# Patient Record
Sex: Male | Born: 1950
Health system: Southern US, Community
[De-identification: ages and names within clinical notes are randomized; demographics above are authoritative.]

## PROBLEM LIST (undated history)

## (undated) DIAGNOSIS — I7789 Other specified disorders of arteries and arterioles: Secondary | ICD-10-CM

## (undated) DIAGNOSIS — I351 Nonrheumatic aortic (valve) insufficiency: Secondary | ICD-10-CM

## (undated) DIAGNOSIS — I10 Essential (primary) hypertension: Secondary | ICD-10-CM

## (undated) DIAGNOSIS — E785 Hyperlipidemia, unspecified: Secondary | ICD-10-CM

## (undated) DIAGNOSIS — I4891 Unspecified atrial fibrillation: Secondary | ICD-10-CM

## (undated) DIAGNOSIS — K4090 Unilateral inguinal hernia, without obstruction or gangrene, not specified as recurrent: Secondary | ICD-10-CM

## (undated) HISTORY — DX: Other specified disorders of arteries and arterioles: I77.89

## (undated) HISTORY — DX: Nonrheumatic aortic (valve) insufficiency: I35.1

## (undated) HISTORY — DX: Essential (primary) hypertension: I10

## (undated) HISTORY — DX: Hyperlipidemia, unspecified: E78.5

## (undated) HISTORY — PX: HERNIA REPAIR: SHX51

## (undated) HISTORY — DX: Unspecified atrial fibrillation: I48.91

## (undated) HISTORY — PX: BACK SURGERY: SHX140

---

## 2016-07-02 ENCOUNTER — Other Ambulatory Visit: Payer: Self-pay | Admitting: Internal Medicine

## 2016-07-02 DIAGNOSIS — R011 Cardiac murmur, unspecified: Secondary | ICD-10-CM

## 2016-07-04 ENCOUNTER — Ambulatory Visit (HOSPITAL_COMMUNITY): Payer: BLUE CROSS/BLUE SHIELD | Attending: Cardiology

## 2016-07-04 ENCOUNTER — Other Ambulatory Visit: Payer: Self-pay

## 2016-07-04 ENCOUNTER — Encounter (INDEPENDENT_AMBULATORY_CARE_PROVIDER_SITE_OTHER): Payer: Self-pay

## 2016-07-04 DIAGNOSIS — R011 Cardiac murmur, unspecified: Secondary | ICD-10-CM | POA: Diagnosis not present

## 2016-07-09 ENCOUNTER — Encounter (HOSPITAL_BASED_OUTPATIENT_CLINIC_OR_DEPARTMENT_OTHER): Payer: Self-pay | Admitting: *Deleted

## 2016-07-16 ENCOUNTER — Ambulatory Visit (HOSPITAL_BASED_OUTPATIENT_CLINIC_OR_DEPARTMENT_OTHER)
Admission: RE | Admit: 2016-07-16 | Discharge: 2016-07-16 | Disposition: A | Discharge status: Home / Self Care | Payer: BLUE CROSS/BLUE SHIELD | Source: Ambulatory Visit | Attending: Surgery | Admitting: Surgery

## 2016-07-16 ENCOUNTER — Encounter (HOSPITAL_BASED_OUTPATIENT_CLINIC_OR_DEPARTMENT_OTHER): Payer: Self-pay

## 2016-07-16 ENCOUNTER — Ambulatory Visit: Payer: Self-pay | Admitting: Surgery

## 2016-07-16 ENCOUNTER — Encounter (HOSPITAL_BASED_OUTPATIENT_CLINIC_OR_DEPARTMENT_OTHER)
Admission: RE | Disposition: A | Discharge status: Home / Self Care | Payer: Self-pay | Source: Ambulatory Visit | Attending: Surgery

## 2016-07-16 ENCOUNTER — Ambulatory Visit (HOSPITAL_BASED_OUTPATIENT_CLINIC_OR_DEPARTMENT_OTHER): Payer: BLUE CROSS/BLUE SHIELD | Admitting: Certified Registered"

## 2016-07-16 DIAGNOSIS — K4090 Unilateral inguinal hernia, without obstruction or gangrene, not specified as recurrent: Secondary | ICD-10-CM | POA: Insufficient documentation

## 2016-07-16 DIAGNOSIS — I10 Essential (primary) hypertension: Secondary | ICD-10-CM | POA: Diagnosis not present

## 2016-07-16 HISTORY — PX: INSERTION OF MESH: SHX5868

## 2016-07-16 HISTORY — DX: Unilateral inguinal hernia, without obstruction or gangrene, not specified as recurrent: K40.90

## 2016-07-16 HISTORY — PX: INGUINAL HERNIA REPAIR: SHX194

## 2016-07-16 SURGERY — REPAIR, HERNIA, INGUINAL, ADULT
Anesthesia: General | Site: Inguinal | Laterality: Left

## 2016-07-16 MED ORDER — BUPIVACAINE-EPINEPHRINE (PF) 0.5% -1:200000 IJ SOLN
INTRAMUSCULAR | Status: DC | PRN
  Administered 2016-07-16: 25 mL

## 2016-07-16 MED ORDER — CEFAZOLIN SODIUM-DEXTROSE 2-4 GM/100ML-% IV SOLN
2.0000 g | INTRAVENOUS | Status: AC
  Administered 2016-07-16: 2 g via INTRAVENOUS

## 2016-07-16 MED ORDER — CEFAZOLIN SODIUM-DEXTROSE 2-4 GM/100ML-% IV SOLN
INTRAVENOUS | Status: AC
  Filled 2016-07-16: qty 100

## 2016-07-16 MED ORDER — CHLORHEXIDINE GLUCONATE CLOTH 2 % EX PADS: 6.0000 | MEDICATED_PAD | Freq: Once | CUTANEOUS | Status: DC

## 2016-07-16 MED ORDER — 0.9 % SODIUM CHLORIDE (POUR BTL) OPTIME
TOPICAL | Status: DC | PRN
  Administered 2016-07-16: 100 mL

## 2016-07-16 MED ORDER — PROPOFOL 10 MG/ML IV BOLUS
INTRAVENOUS | Status: DC | PRN
  Administered 2016-07-16: 200 mg via INTRAVENOUS

## 2016-07-16 MED ORDER — SCOPOLAMINE 1 MG/3DAYS TD PT72: 1.0000 | MEDICATED_PATCH | Freq: Once | TRANSDERMAL | Status: DC | PRN

## 2016-07-16 MED ORDER — HEPARIN SODIUM (PORCINE) 5000 UNIT/ML IJ SOLN
5000.0000 [IU] | Freq: Once | INTRAMUSCULAR | Status: AC
  Administered 2016-07-16: 5000 [IU] via SUBCUTANEOUS

## 2016-07-16 MED ORDER — FENTANYL CITRATE (PF) 100 MCG/2ML IJ SOLN
INTRAMUSCULAR | Status: AC
  Filled 2016-07-16: qty 2

## 2016-07-16 MED ORDER — LIDOCAINE 2% (20 MG/ML) 5 ML SYRINGE
INTRAMUSCULAR | Status: AC
  Filled 2016-07-16: qty 5

## 2016-07-16 MED ORDER — HYDROCODONE-ACETAMINOPHEN 5-325 MG PO TABS: 1.0000 | ORAL_TABLET | ORAL | 0 refills | Status: DC | PRN

## 2016-07-16 MED ORDER — LACTATED RINGERS IV SOLN
INTRAVENOUS | Status: DC
  Administered 2016-07-16 (×2): via INTRAVENOUS

## 2016-07-16 MED ORDER — EPHEDRINE SULFATE 50 MG/ML IJ SOLN
INTRAMUSCULAR | Status: DC | PRN
  Administered 2016-07-16 (×2): 10 mg via INTRAVENOUS

## 2016-07-16 MED ORDER — MIDAZOLAM HCL 2 MG/2ML IJ SOLN
1.0000 mg | INTRAMUSCULAR | Status: DC | PRN
  Administered 2016-07-16: 2 mg via INTRAVENOUS

## 2016-07-16 MED ORDER — BUPIVACAINE HCL (PF) 0.25 % IJ SOLN
INTRAMUSCULAR | Status: AC
  Filled 2016-07-16: qty 30

## 2016-07-16 MED ORDER — FENTANYL CITRATE (PF) 100 MCG/2ML IJ SOLN
INTRAMUSCULAR | Status: AC
Start: 2016-07-16 — End: 2016-07-16
  Filled 2016-07-16: qty 2

## 2016-07-16 MED ORDER — FENTANYL CITRATE (PF) 100 MCG/2ML IJ SOLN
50.0000 ug | INTRAMUSCULAR | Status: DC | PRN
  Administered 2016-07-16: 25 ug via INTRAVENOUS
  Administered 2016-07-16: 50 ug via INTRAVENOUS

## 2016-07-16 MED ORDER — HEPARIN SODIUM (PORCINE) 5000 UNIT/ML IJ SOLN
INTRAMUSCULAR | Status: AC
  Filled 2016-07-16: qty 1

## 2016-07-16 MED ORDER — SUGAMMADEX SODIUM 500 MG/5ML IV SOLN
INTRAVENOUS | Status: AC
  Filled 2016-07-16: qty 5

## 2016-07-16 MED ORDER — LIDOCAINE HCL (PF) 1 % IJ SOLN
INTRAMUSCULAR | Status: AC
  Filled 2016-07-16: qty 30

## 2016-07-16 MED ORDER — BUPIVACAINE HCL (PF) 0.25 % IJ SOLN
INTRAMUSCULAR | Status: DC | PRN
  Administered 2016-07-16: 10 mL

## 2016-07-16 MED ORDER — SUGAMMADEX SODIUM 500 MG/5ML IV SOLN
INTRAVENOUS | Status: DC | PRN
  Administered 2016-07-16: 260 mg via INTRAVENOUS

## 2016-07-16 MED ORDER — SODIUM BICARBONATE 4 % IV SOLN
INTRAVENOUS | Status: AC
  Filled 2016-07-16: qty 5

## 2016-07-16 MED ORDER — ONDANSETRON HCL 4 MG/2ML IJ SOLN
INTRAMUSCULAR | Status: DC | PRN
  Administered 2016-07-16: 4 mg via INTRAVENOUS

## 2016-07-16 MED ORDER — ONDANSETRON HCL 4 MG/2ML IJ SOLN
INTRAMUSCULAR | Status: AC
  Filled 2016-07-16: qty 2

## 2016-07-16 MED ORDER — MIDAZOLAM HCL 2 MG/2ML IJ SOLN
INTRAMUSCULAR | Status: AC
  Filled 2016-07-16: qty 2

## 2016-07-16 MED ORDER — ROCURONIUM BROMIDE 100 MG/10ML IV SOLN
INTRAVENOUS | Status: DC | PRN
  Administered 2016-07-16: 40 mg via INTRAVENOUS

## 2016-07-16 MED ORDER — LIDOCAINE HCL (CARDIAC) 20 MG/ML IV SOLN
INTRAVENOUS | Status: DC | PRN
  Administered 2016-07-16: 20 mg via INTRAVENOUS

## 2016-07-16 MED ORDER — DEXAMETHASONE SODIUM PHOSPHATE 10 MG/ML IJ SOLN
INTRAMUSCULAR | Status: AC
  Filled 2016-07-16: qty 1

## 2016-07-16 MED ORDER — DEXAMETHASONE SODIUM PHOSPHATE 4 MG/ML IJ SOLN
INTRAMUSCULAR | Status: DC | PRN
  Administered 2016-07-16: 10 mg via INTRAVENOUS

## 2016-07-16 SURGICAL SUPPLY — 47 items
ADH SKN CLS APL DERMABOND .7 (GAUZE/BANDAGES/DRESSINGS) ×1
BLADE CLIPPER SURG (BLADE) ×1 IMPLANT
BLADE SURG 15 STRL LF DISP TIS (BLADE) ×1 IMPLANT
BLADE SURG 15 STRL SS (BLADE) ×2
CANISTER SUCT 1200ML W/VALVE (MISCELLANEOUS) ×2 IMPLANT
CLEANER CAUTERY TIP 5X5 PAD (MISCELLANEOUS) ×1 IMPLANT
COVER BACK TABLE 60X90IN (DRAPES) ×2 IMPLANT
COVER MAYO STAND STRL (DRAPES) ×2 IMPLANT
DECANTER SPIKE VIAL GLASS SM (MISCELLANEOUS) ×2 IMPLANT
DERMABOND ADVANCED (GAUZE/BANDAGES/DRESSINGS) ×1
DERMABOND ADVANCED .7 DNX12 (GAUZE/BANDAGES/DRESSINGS) ×1 IMPLANT
DRAIN PENROSE 1/2X12 LTX STRL (WOUND CARE) ×2 IMPLANT
DRAPE LAPAROTOMY T 102X78X121 (DRAPES) ×2 IMPLANT
DRAPE UTILITY XL STRL (DRAPES) ×1 IMPLANT
ELECT COATED BLADE 2.86 ST (ELECTRODE) ×1 IMPLANT
ELECT REM PT RETURN 9FT ADLT (ELECTROSURGICAL) ×2
ELECTRODE REM PT RTRN 9FT ADLT (ELECTROSURGICAL) ×1 IMPLANT
GLOVE BIO SURGEON STRL SZ8 (GLOVE) ×2 IMPLANT
GLOVE BIOGEL PI IND STRL 7.5 (GLOVE) IMPLANT
GLOVE BIOGEL PI INDICATOR 7.5 (GLOVE) ×1
GLOVE SURG SYN 7.5  E (GLOVE) ×1
GLOVE SURG SYN 7.5 E (GLOVE) ×1 IMPLANT
GLOVE SURG SYN 7.5 PF PI (GLOVE) IMPLANT
GOWN STRL REUS W/ TWL LRG LVL3 (GOWN DISPOSABLE) ×1 IMPLANT
GOWN STRL REUS W/ TWL XL LVL3 (GOWN DISPOSABLE) ×1 IMPLANT
GOWN STRL REUS W/TWL LRG LVL3 (GOWN DISPOSABLE) ×2
GOWN STRL REUS W/TWL XL LVL3 (GOWN DISPOSABLE) ×2
MESH HERNIA 3X6 (Mesh General) ×1 IMPLANT
NDL HYPO 25X1 1.5 SAFETY (NEEDLE) ×1 IMPLANT
NEEDLE HYPO 25X1 1.5 SAFETY (NEEDLE) ×2 IMPLANT
NS IRRIG 1000ML POUR BTL (IV SOLUTION) ×2 IMPLANT
PACK BASIN DAY SURGERY FS (CUSTOM PROCEDURE TRAY) ×2 IMPLANT
PAD CLEANER CAUTERY TIP 5X5 (MISCELLANEOUS) ×1
PENCIL BUTTON HOLSTER BLD 10FT (ELECTRODE) ×2 IMPLANT
SLEEVE SCD COMPRESS KNEE MED (MISCELLANEOUS) ×1 IMPLANT
SPONGE LAP 4X18 X RAY DECT (DISPOSABLE) ×2 IMPLANT
SUT MON AB 5-0 PS2 18 (SUTURE) ×2 IMPLANT
SUT PROLENE 2 0 CT2 30 (SUTURE) ×5 IMPLANT
SUT VIC AB 2-0 SH 27 (SUTURE) ×6
SUT VIC AB 2-0 SH 27XBRD (SUTURE) ×1 IMPLANT
SUT VIC AB 4-0 SH 18 (SUTURE) ×2 IMPLANT
SYR BULB 3OZ (MISCELLANEOUS) ×2 IMPLANT
SYR CONTROL 10ML LL (SYRINGE) ×2 IMPLANT
TOWEL OR 17X24 6PK STRL BLUE (TOWEL DISPOSABLE) ×4 IMPLANT
TRAY DSU PREP LF (CUSTOM PROCEDURE TRAY) ×2 IMPLANT
TUBE CONNECTING 20X1/4 (TUBING) ×2 IMPLANT
YANKAUER SUCT BULB TIP NO VENT (SUCTIONS) ×2 IMPLANT

## 2016-07-16 NOTE — Transfer of Care (Signed)
Immediate Anesthesia Transfer of Care Note  Patient: Carlos NettleWilliam A Bailey  Procedure(s) Performed: Procedure(s): OPEN LEFT INGUINAL HERNIA REPAIR WITH MESH (Left) INSERTION OF MESH (Left)  Patient Location: PACU  Anesthesia Type:GA combined with regional for post-op pain  Level of Consciousness: awake, alert , oriented and patient cooperative  Airway & Oxygen Therapy: Patient Spontanous Breathing and Patient connected to face mask oxygen  Post-op Assessment: Report given to RN and Post -op Vital signs reviewed and stable  Post vital signs: Reviewed and stable  Last Vitals:  Vitals:   07/16/16 1140 07/16/16 1145  BP:  (!) 171/89  Pulse: 70 75  Resp: 17 16  Temp:      Last Pain:  Vitals:   07/16/16 1014  TempSrc: Oral         Complications: No apparent anesthesia complications

## 2016-07-16 NOTE — Interval H&P Note (Signed)
History and Physical Interval Note:  07/16/2016 12:33 PM  Carlos Bailey  has presented today for surgery, with the diagnosis of left inguinal hernia  The various methods of treatment have been discussed with the patient and family. After consideration of risks, benefits and other options for treatment, the patient has consented to  Procedure(s): OPEN LEFT INGUINAL HERNIA REPAIR (Left) as a surgical intervention .  The patient's history has been reviewed, patient examined, no change in status, stable for surgery.  I have reviewed the patient's chart and labs.  Questions were answered to the patient's satisfaction.     Blane Worthington B

## 2016-07-16 NOTE — Op Note (Signed)
Surgeon: Wenda LowMatt Jassiel Flye, MD, FACS  Asst:  none  Anes:  general  Procedure: Open left inguinal hernia repair (sliding hernia)  Diagnosis: Prominent slider containing sigmoid colon  Complications: none  EBL:   5 cc  Drains: none  Description of Procedure:  The patient was taken to OR 6 at [facility].  After anesthesia was administered and the patient was prepped a timeout was performed.  Patient had received a block per anesthesia.  Endotracheal anesthesia was eventually needed because of the slider.    An oblique incision was made and carried down to the external oblique.  The fascia was opened along the fibers.  The cord was full and contained a prominent hernia that reduced.  Penrose around the cord.  Anteromedial dissection revealed a sac that was entered and the sigmoid colon was found to be about half of the wall of the hernia.    With better relaxation, this was dissected somewhat free, the opened sac was explored digitally before being closed with vicry.  This was reduced and the dilated ring was approximated with a single 2-0 prolene simple suture.  Marlex type mesh was cut to fit the floor and was sewn to the inguinal ligament with interrupted 2-0 prolene and medially with a running 2-0 prolene.  The arms of the mesh which was cut to go around the cord were sewn together with a horizontal 2-0 prolene.  The external oblique were closed with 2-0 vicryl and the wound was closed in layers with vicryl and 4-0 monocryl on the skin.  Dermabond was applied.   The patient tolerated the procedure well and was taken to the PACU in stable condition.     Matt B. Daphine DeutscherMartin, MD, [facility], [place] [phone]

## 2016-07-16 NOTE — Anesthesia Procedure Notes (Signed)
Anesthesia Regional Block:  TAP block  Pre-Anesthetic Checklist: ,, timeout performed, Correct Patient, Correct Site, Correct Laterality, Correct Procedure, Correct Position, site marked, Risks and benefits discussed,  Surgical consent,  Pre-op evaluation,  At surgeon's request and post-op pain management  Laterality: Left  Prep: chloraprep       Needles:   Needle Type: Echogenic Needle     Needle Length: 9cm 9 cm Needle Gauge: 21 and 21 G    Additional Needles:  Procedures: ultrasound guided (picture in chart) TAP block Narrative:  Start time: 07/16/2016 11:32 AM End time: 07/16/2016 11:39 AM Injection made incrementally with aspirations every 5 mL.  Performed by: Personally  Anesthesiologist: Marcene DuosFITZGERALD, Joice Nazario

## 2016-07-16 NOTE — Discharge Instructions (Addendum)
Use ice pack for pain control as needed Ibuprofen can be used in a couple of days if desired If trouble voiding, sit down in a tub of warm water to void May be up and walk up stairs etc Lifting should be limited by straining-don't strain to lift May resume swimming in a couple of weeks.     Post Anesthesia Home Care Instructions  Activity: Get plenty of rest for the remainder of the day. A responsible adult should stay with you for 24 hours following the procedure.  For the next 24 hours, DO NOT: -Drive a car -Advertising copywriterperate machinery -Drink alcoholic beverages -Take any medication unless instructed by your physician -Make any legal decisions or sign important papers.  Meals: Start with liquid foods such as gelatin or soup. Progress to regular foods as tolerated. Avoid greasy, spicy, heavy foods. If nausea and/or vomiting occur, drink only clear liquids until the nausea and/or vomiting subsides. Call your physician if vomiting continues.  Special Instructions/Symptoms: Your throat may feel dry or sore from the anesthesia or the breathing tube placed in your throat during surgery. If this causes discomfort, gargle with warm salt water. The discomfort should disappear within 24 hours.  If you had a scopolamine patch placed behind your ear for the management of post- operative nausea and/or vomiting:  1. The medication in the patch is effective for 72 hours, after which it should be removed.  Wrap patch in a tissue and discard in the trash. Wash hands thoroughly with soap and water. 2. You may remove the patch earlier than 72 hours if you experience unpleasant side effects which may include dry mouth, dizziness or visual disturbances. 3. Avoid touching the patch. Wash your hands with soap and water after contact with the patch.

## 2016-07-16 NOTE — Anesthesia Procedure Notes (Signed)
Procedure Name: Intubation Date/Time: 07/16/2016 1:53 PM Performed by: Pheonix Wisby D Pre-anesthesia Checklist: Patient identified, Emergency Drugs available, Suction available and Patient being monitored Patient Re-evaluated:Patient Re-evaluated prior to inductionOxygen Delivery Method: Circle system utilized Preoxygenation: Pre-oxygenation with 100% oxygen Intubation Type: IV induction Ventilation: Mask ventilation without difficulty Laryngoscope Size: Mac and 3 Grade View: Grade II Tube type: Oral Tube size: 7.0 mm Number of attempts: 1 Airway Equipment and Method: Stylet and Oral airway Placement Confirmation: ETT inserted through vocal cords under direct vision,  positive ETCO2 and breath sounds checked- equal and bilateral Secured at: 21 cm Tube secured with: Tape Dental Injury: Teeth and Oropharynx as per pre-operative assessment

## 2016-07-16 NOTE — Anesthesia Preprocedure Evaluation (Addendum)
Anesthesia Evaluation  Patient identified by MRN, date of birth, ID band Patient awake    Reviewed: Allergy & Precautions, NPO status , Patient's Chart, lab work & pertinent test results  Airway Mallampati: II  TM Distance: <3 FB Neck ROM: Full    Dental  (+) Teeth Intact, Dental Advisory Given   Pulmonary neg pulmonary ROS,    Pulmonary exam normal breath sounds clear to auscultation       Cardiovascular hypertension (no meds currently), Normal cardiovascular exam+ Valvular Problems/Murmurs AI  Rhythm:Regular Rate:Normal  Echo 11/17: Study Conclusions  - Left ventricle: The cavity size was normal. Wall thickness was increased increased in a pattern of mild to moderate LVH. There was mild focal basal hypertrophy of the septum. Systolic function was normal. The estimated ejection fraction was in the range of 55% to 60%. Doppler parameters are consistent with abnormal left ventricular relaxation (grade 1 diastolic dysfunction). - Aortic valve: There was mild regurgitation. Valve area (VTI): 2.9 cm^2. Valve area (Vmax): 2.44 cm^2. Valve area (Vmean): 2.93 cm^2. - Mitral valve: There was mild regurgitation. - Left atrium: The atrium was mildly dilated. - Impressions: The ascending aorta is mild to moderately dilated with mild AI. Consider chest CT to further evaluate.  Impressions:  - The ascending aorta is mild to moderately dilated with mild AI. Consider chest CT to further evaluate.   Neuro/Psych negative neurological ROS     GI/Hepatic negative GI ROS, Neg liver ROS,   Endo/Other  negative endocrine ROS  Renal/GU negative Renal ROS     Musculoskeletal  (+) Arthritis , Osteoarthritis,    Abdominal   Peds  Hematology negative hematology ROS (+)   Anesthesia Other Findings Day of surgery medications reviewed with the patient.  Left inguinal hernia  Reproductive/Obstetrics                             Anesthesia Physical Anesthesia Plan  ASA: II  Anesthesia Plan: General   Post-op Pain Management:  Regional for Post-op pain   Induction: Intravenous  Airway Management Planned: Oral ETT  Additional Equipment:   Intra-op Plan:   Post-operative Plan: Extubation in OR  Informed Consent: I have reviewed the patients History and Physical, chart, labs and discussed the procedure including the risks, benefits and alternatives for the proposed anesthesia with the patient or authorized representative who has indicated his/her understanding and acceptance.   Dental advisory given  Plan Discussed with: CRNA  Anesthesia Plan Comments: (Risks/benefits of general anesthesia discussed with patient including risk of damage to teeth, lips, gum, and tongue, nausea/vomiting, allergic reactions to medications, and the possibility of heart attack, stroke and death.  All patient questions answered.  Patient wishes to proceed.)        Anesthesia Quick Evaluation

## 2016-07-16 NOTE — Anesthesia Procedure Notes (Signed)
Procedure Name: LMA Insertion Date/Time: 07/16/2016 12:41 PM Performed by: Marai Teehan D Pre-anesthesia Checklist: Patient identified, Emergency Drugs available, Suction available and Patient being monitored Patient Re-evaluated:Patient Re-evaluated prior to inductionOxygen Delivery Method: Circle system utilized Preoxygenation: Pre-oxygenation with 100% oxygen Intubation Type: IV induction Ventilation: Mask ventilation without difficulty LMA: LMA inserted LMA Size: 4.0 Number of attempts: 1 Airway Equipment and Method: Bite block Placement Confirmation: positive ETCO2 Tube secured with: Tape Dental Injury: Teeth and Oropharynx as per pre-operative assessment

## 2016-07-16 NOTE — H&P (Signed)
Carlos Bailey 06/28/2016 9:12 AM Location: [facility] Patient #: [id number] DOB: 17-Jul-1951 Married / Language: English / Race: White Male   History of Present Illness Carlos Bailey(Carlos Bailey B. Daphine DeutscherMartin MD; 06/28/2016 9:50 AM) The patient is a 65 year old male who presents with an inguinal hernia. The hernia(s) is/are located on the left side (bulging and notices gurgling). He had a prior RIH when he was in the first grade. Denies obstruction. Followed by Rodrigo RanMark Perini. I gave him a hernia book and discussed open and laparoscopic/robotic hernia repairs. Will proceed with open LEFT inguinal hernia repair.    Other Problems Doristine Devoid(Chemira Jones, CMA; 06/28/2016 9:12 AM) Inguinal Hernia Other disease, cancer, significant illness  Past Surgical History Doristine Devoid(Chemira Jones, CMA; 06/28/2016 9:12 AM) Open Inguinal Hernia Surgery Right.  Diagnostic Studies History Doristine Devoid(Chemira Jones, CMA; 06/28/2016 9:12 AM) Colonoscopy 5-10 years ago  Allergies Doristine Devoid(Chemira Jones, CMA; 06/28/2016 9:13 AM) Codeine Sulfate *ANALGESICS - OPIOID*  Medication History (Doristine DevoidChemira Jones, CMA; 06/28/2016 9:13 AM) Butalbital-Aspirin-Caffeine (50-325-40MG  Capsule, Oral) Active. Medications Reconciled  Social History Doristine Devoid(Chemira Jones, CMA; 06/28/2016 9:12 AM) Alcohol use Moderate alcohol use. Caffeine use Coffee, Tea. No drug use Tobacco use Never smoker.  Family History Doristine Devoid(Chemira Jones, CMA; 06/28/2016 9:12 AM) Arthritis Father, Mother. Diabetes Mellitus Sister. Heart Disease Father.    Review of Systems Doristine Devoid(Chemira Jones CMA; 06/28/2016 9:12 AM) General Not Present- Appetite Loss, Chills, Fatigue, Fever, Night Sweats, Weight Gain and Weight Loss. Skin Not Present- Change in Wart/Mole, Dryness, Hives, Jaundice, New Lesions, Non-Healing Wounds, Rash and Ulcer. HEENT Not Present- Earache, Hearing Loss, Hoarseness, Nose Bleed, Oral Ulcers, Ringing in the Ears, Seasonal Allergies, Sinus Pain, Sore Throat, Visual  Disturbances, Wears glasses/contact lenses and Yellow Eyes. Respiratory Present- Snoring. Not Present- Bloody sputum, Chronic Cough, Difficulty Breathing and Wheezing. Breast Not Present- Breast Mass, Breast Pain, Nipple Discharge and Skin Changes. Cardiovascular Not Present- Chest Pain, Difficulty Breathing Lying Down, Leg Cramps, Palpitations, Rapid Heart Rate, Shortness of Breath and Swelling of Extremities. Gastrointestinal Present- Change in Bowel Habits and Hemorrhoids. Not Present- Abdominal Pain, Bloating, Bloody Stool, Chronic diarrhea, Constipation, Difficulty Swallowing, Excessive gas, Gets full quickly at meals, Indigestion, Nausea, Rectal Pain and Vomiting. Male Genitourinary Not Present- Blood in Urine, Change in Urinary Stream, Frequency, Impotence, Nocturia, Painful Urination, Urgency and Urine Leakage. Musculoskeletal Not Present- Back Pain, Joint Pain, Joint Stiffness, Muscle Pain, Muscle Weakness and Swelling of Extremities. Neurological Not Present- Decreased Memory, Fainting, Headaches, Numbness, Seizures, Tingling, Tremor, Trouble walking and Weakness. Psychiatric Not Present- Anxiety, Bipolar, Change in Sleep Pattern, Depression, Fearful and Frequent crying. Endocrine Not Present- Cold Intolerance, Excessive Hunger, Hair Changes, Heat Intolerance, Hot flashes and New Diabetes. Hematology Not Present- Blood Thinners, Easy Bruising, Excessive bleeding, Gland problems, HIV and Persistent Infections.  Vitals (Chemira Jones CMA; 06/28/2016 9:13 AM) 06/28/2016 9:12 AM Weight: 152.4 lb Height: 68in Body Surface Area: 1.82 m Body Mass Index: 23.17 kg/m  Temp.: 98.73F(Oral)  Pulse: 66 (Regular)  BP: 150/100 (Sitting, Left Arm, Standard)       Physical Exam (Kortney Schoenfelder B. Daphine DeutscherMartin MD; 06/28/2016 9:53 AM) General Note: WDWNWM NAD HEENT glasses Neck without bruits Chest clear Heart SR with 1/6 SEM suggesting AI Abdomen prominent bulge in the left inguinal  region Ext FROM Neuro alert and oriented x 3 with normal motor and sensory function.     Assessment & Plan Carlos Bailey(Sahily Carlos B. Daphine DeutscherMartin MD; 06/28/2016 9:56 AM) LEFT GROIN HERNIA (K40.90) Impression: LIH plan open repair with mesh. I have discussed repair and complications not limited to  pain, numbness, infection and recurrence. I discussed the aortic murmur with Dr. Brunilda Payoran Patterson who will relate this to Dr. Waynard EdwardsPerini

## 2016-07-16 NOTE — Anesthesia Postprocedure Evaluation (Signed)
Anesthesia Post Note  Patient: Carlos NettleWilliam A Bailey  Procedure(s) Performed: Procedure(s) (LRB): OPEN LEFT INGUINAL HERNIA REPAIR WITH MESH (Left) INSERTION OF MESH (Left)  Patient location during evaluation: PACU Anesthesia Type: General and Regional Level of consciousness: awake and alert Pain management: pain level controlled Vital Signs Assessment: post-procedure vital signs reviewed and stable Respiratory status: spontaneous breathing, nonlabored ventilation, respiratory function stable and patient connected to nasal cannula oxygen Cardiovascular status: blood pressure returned to baseline and stable Postop Assessment: no signs of nausea or vomiting Anesthetic complications: no    Last Vitals:  Vitals:   07/16/16 1515 07/16/16 1551  BP: (!) 179/95   Pulse: 94 83  Resp: 13   Temp:  36.4 C    Last Pain:  Vitals:   07/16/16 1551  TempSrc: Oral  PainSc: 2                  Kennieth RadFitzgerald, Lenvil Swaim E

## 2016-07-17 ENCOUNTER — Encounter (HOSPITAL_BASED_OUTPATIENT_CLINIC_OR_DEPARTMENT_OTHER): Payer: Self-pay | Admitting: Surgery

## 2018-09-08 ENCOUNTER — Encounter: Payer: Self-pay | Admitting: Cardiology

## 2018-09-15 ENCOUNTER — Other Ambulatory Visit (HOSPITAL_COMMUNITY): Payer: Self-pay | Admitting: Internal Medicine

## 2018-09-15 ENCOUNTER — Other Ambulatory Visit: Payer: Self-pay | Admitting: Internal Medicine

## 2018-09-15 DIAGNOSIS — I351 Nonrheumatic aortic (valve) insufficiency: Secondary | ICD-10-CM

## 2018-09-16 ENCOUNTER — Other Ambulatory Visit: Payer: Self-pay | Admitting: Internal Medicine

## 2018-09-16 DIAGNOSIS — E785 Hyperlipidemia, unspecified: Secondary | ICD-10-CM

## 2018-09-18 ENCOUNTER — Ambulatory Visit (HOSPITAL_COMMUNITY): Payer: BLUE CROSS/BLUE SHIELD | Attending: Cardiovascular Disease

## 2018-09-18 DIAGNOSIS — I351 Nonrheumatic aortic (valve) insufficiency: Secondary | ICD-10-CM | POA: Diagnosis present

## 2018-09-23 ENCOUNTER — Ambulatory Visit
Admission: RE | Admit: 2018-09-23 | Discharge: 2018-09-23 | Disposition: A | Discharge status: Home / Self Care | Payer: BLUE CROSS/BLUE SHIELD | Source: Ambulatory Visit | Attending: Internal Medicine | Admitting: Internal Medicine

## 2018-09-23 DIAGNOSIS — E785 Hyperlipidemia, unspecified: Secondary | ICD-10-CM

## 2018-10-03 NOTE — Progress Notes (Signed)
Cardiology Office Note   Date:  10/06/2018   ID:  Carlos Bailey, DOB 28-Jun-1951, MRN [id number]  PCP:  Carlos Ran, MD  Cardiologist:   No primary care provider on file. Referring:  Carlos Ran, MD  No chief complaint on file.     History of Present Illness: Carlos Bailey is a 68 y.o. male who is referred by Carlos Ran, MD for evaluation of AI.   This was moderate on echo in January.  I reviewed the echo images for this appt.   There was normal LV size and function.   The patient's had no prior cardiac history.  He is active.  He is a Counselling psychologist exercises.  He denies any cardiovascular symptoms.  The patient denies any new symptoms such as chest discomfort, neck or arm discomfort. There has been no new shortness of breath, PND or orthopnea. There have been no reported palpitations, presyncope or syncope.  He was noted to have a murmur and also "flutter".  Prior to hernia repair recently.  He had a coronary calcium score which was 626 which put him in the 82nd percentile.  Echocardiogram demonstrated aortic insufficiency which was moderate.  I have reviewed these images for this appointment.  He is referred for follow-up.   Past Medical History:  Diagnosis Date  . HTN (hypertension)   . Hyperlipemia   . Inguinal hernia    left    Past Surgical History:  Procedure Laterality Date  . BACK SURGERY    . HERNIA REPAIR     RIHR as child  . INGUINAL HERNIA REPAIR Left 07/16/2016   Procedure: OPEN LEFT INGUINAL HERNIA REPAIR WITH MESH;  Surgeon: Luretha Murphy, MD;  Location: [facility];  Service: General;  Laterality: Left;  . INSERTION OF MESH Left 07/16/2016   Procedure: INSERTION OF MESH;  Surgeon: Luretha Murphy, MD;  Location: [facility];  Service: General;  Laterality: Left;     Current Outpatient Medications  Medication Sig Dispense Refill  . butalbital-aspirin-caffeine (FIORINAL) 50-325-40 MG capsule Take 1 capsule by mouth 2  (two) times daily as needed for headache.    . rosuvastatin (CRESTOR) 10 MG tablet Take 10 mg by mouth daily.    Marland Kitchen olmesartan-hydrochlorothiazide (BENICAR HCT) 40-25 MG tablet Take 1 tablet by mouth daily. 90 tablet 3   No current facility-administered medications for this visit.     Allergies:   Codeine    Social History:  The patient  reports that he has never smoked. He has never used smokeless tobacco. He reports current alcohol use. He reports that he does not use drugs.   Family History:  The patient's family history includes Aneurysm in his mother; Diabetes Mellitus II in his mother and sister; Obesity in his sister; Stroke in his mother.    ROS:  Please see the history of present illness.   Otherwise, review of systems are positive for none.   All other systems are reviewed and negative.    PHYSICAL EXAM: VS:  BP (!) 158/82 (BP Location: Left Arm, Patient Position: Sitting, Cuff Size: Normal)   Pulse 66   Ht 5\' 8"  (1.727 m)   Wt 157 lb (71.2 kg)   BMI 23.87 kg/m  , BMI Body mass index is 23.87 kg/m. GENERAL:  Well appearing HEENT:  Pupils equal round and reactive, fundi not visualized, oral mucosa unremarkable NECK:  No jugular venous distention, waveform within normal limits, carotid upstroke brisk and symmetric, no bruits,  no thyromegaly LYMPHATICS:  No cervical, inguinal adenopathy LUNGS:  Clear to auscultation bilaterally BACK:  No CVA tenderness CHEST:  Unremarkable HEART:  PMI not displaced or sustained,S1 and S2 within normal limits, no S3, no S4, no clicks, no rubs, 2 out of 6 diastolic murmur heard best at the third left intercostal space and lasting well into diastole, no systolic murmurs ABD:  Flat, positive bowel sounds normal in frequency in pitch, no bruits, no rebound, no guarding, no midline pulsatile mass, no hepatomegaly, no splenomegaly EXT:  2 plus pulses throughout, no edema, no cyanosis no clubbing SKIN:  No rashes no nodules NEURO:  Cranial nerves  II through XII grossly intact, motor grossly intact throughout PSYCH:  Cognitively intact, oriented to person place and time    EKG:  EKG is ordered today. The ekg ordered today demonstrates sinus rhythm, rate 66, left axis deviation, poor anterior R wave progression, no acute ST-T wave changes.   Recent Labs: No results found for requested labs within last 8760 hours.    Lipid Panel No results found for: CHOL, TRIG, HDL, CHOLHDL, VLDL, LDLCALC, LDLDIRECT    Wt Readings from Last 3 Encounters:  10/06/18 157 lb (71.2 kg)  07/16/16 143 lb 6.4 oz (65 kg)      Other studies Reviewed: Additional studies/ records that were reviewed today include: Labs, CT, echo images personally reviewed. Review of the above records demonstrates:  Please see elsewhere in the note.     ASSESSMENT AND PLAN:  AORTIC INSUFFICIENCY:  This was moderate with normal LV size and function.  I reviewed these images.  I will plan another echo in 1 year and then likely will follow this clinically thereafter with intermittent echocardiography.  We discussed the pathophysiology of this.  He does have some mild thickening of the aortic leaflets and some mild aortic enlargement that can be followed as well with the echo.  ELEVATED CORONARY CALCIUM: He has elevated coronary calcium as described.  I will bring the patient back for a POET (Plain Old Exercise Test). This will allow me to screen for obstructive coronary disease, risk stratify and very importantly provide a prescription for exercise.  He will continue with aggressive risk reduction.  RISK REDUCTION:  His MESA score is 15.4.  Based on this it is patient choice as to whether he should take an aspirin or statin.  He prefers to be aggressive and so we will start taking a low-dose aspirin.  He will have lipids followed as below.  DYSLIPIDEMIA: LDL is 128 with an HDL of 94.  He has been started on a statin.  HTN: His blood pressure is not been at target.  I taken  the liberty of switching him to Benicar HCT 40/25.  He will keep a blood pressure diary.  Current medicines are reviewed at length with the patient today.  The patient does not have concerns regarding medicines.  The following changes have been made:  no change  Labs/ tests ordered today include: Same therapy  Orders Placed This Encounter  Procedures  . EXERCISE TOLERANCE TEST (ETT)  . EKG 12-Lead  . ECHOCARDIOGRAM COMPLETE     Disposition:   FU with in one year.     Signed, Rollene Rotunda, MD  10/06/2018 12:03 PM    [facility]

## 2018-10-06 ENCOUNTER — Encounter: Payer: Self-pay | Admitting: Cardiology

## 2018-10-06 ENCOUNTER — Ambulatory Visit (INDEPENDENT_AMBULATORY_CARE_PROVIDER_SITE_OTHER): Payer: BLUE CROSS/BLUE SHIELD | Admitting: Cardiology

## 2018-10-06 VITALS — BP 158/82 | HR 66 | Ht 68.0 in | Wt 157.0 lb

## 2018-10-06 DIAGNOSIS — I351 Nonrheumatic aortic (valve) insufficiency: Secondary | ICD-10-CM

## 2018-10-06 DIAGNOSIS — I10 Essential (primary) hypertension: Secondary | ICD-10-CM

## 2018-10-06 DIAGNOSIS — R931 Abnormal findings on diagnostic imaging of heart and coronary circulation: Secondary | ICD-10-CM | POA: Diagnosis not present

## 2018-10-06 DIAGNOSIS — E785 Hyperlipidemia, unspecified: Secondary | ICD-10-CM | POA: Diagnosis not present

## 2018-10-06 MED ORDER — OLMESARTAN MEDOXOMIL-HCTZ 40-25 MG PO TABS: 1.0000 | ORAL_TABLET | Freq: Every day | ORAL | 3 refills | Status: DC

## 2018-10-06 NOTE — Patient Instructions (Signed)
Medication Instructions:  STOP- Benicar START- Benicar/HCTZ 40-25 mg daily START- Aspirin 81 mg daily  If you need a refill on your cardiac medications before your next appointment, please call your pharmacy.  Labwork: None Ordered   Testing/Procedures: Your physician has requested that you have an exercise tolerance test. For further information please visit [email or web address] Please also follow instruction sheet, as given.  Your physician has requested that you have an echocardiogram in 1 Year. Echocardiography is a painless test that uses sound waves to create images of your heart. It provides your doctor with information about the size and shape of your heart and how well your heart's chambers and valves are working. This procedure takes approximately one hour. There are no restrictions for this procedure.  Follow-Up: You will need a follow up appointment in 1 Year.  Please call our office 2 months in advance to schedule this appointment.  You may see Dr Antoine Poche or one of the following Advanced Practice Providers on your designated Care Team:   Theodore Demark, PA-C . Joni Reining, DNP, ANP   At [facility], you and your health needs are our priority.  As part of our continuing mission to provide you with exceptional heart care, we have created designated Provider Care Teams.  These Care Teams include your primary Cardiologist (physician) and Advanced Practice Providers (APPs -  Physician Assistants and Nurse Practitioners) who all work together to provide you with the care you need, when you need it.  Thank you for choosing [facility] at [facility]!!

## 2018-10-07 ENCOUNTER — Telehealth (HOSPITAL_COMMUNITY): Payer: Self-pay

## 2018-10-07 NOTE — Telephone Encounter (Signed)
Encounter complete. 

## 2018-10-09 ENCOUNTER — Telehealth (HOSPITAL_COMMUNITY): Payer: Self-pay

## 2018-10-09 NOTE — Telephone Encounter (Signed)
Encounter complete. 

## 2018-10-13 ENCOUNTER — Ambulatory Visit (HOSPITAL_COMMUNITY)
Admission: RE | Admit: 2018-10-13 | Discharge: 2018-10-13 | Disposition: A | Discharge status: Home / Self Care | Payer: BLUE CROSS/BLUE SHIELD | Source: Ambulatory Visit | Attending: Cardiology | Admitting: Cardiology

## 2018-10-13 DIAGNOSIS — R931 Abnormal findings on diagnostic imaging of heart and coronary circulation: Secondary | ICD-10-CM | POA: Insufficient documentation

## 2018-10-13 LAB — EXERCISE TOLERANCE TEST
Estimated workload: 10.1 METS
Exercise duration (min): 9 min
Exercise duration (sec): 0 s
MPHR: 153 {beats}/min
Peak HR: 141 {beats}/min
Percent HR: 92 %
RPE: 19
Rest HR: 68 {beats}/min

## 2019-10-12 ENCOUNTER — Other Ambulatory Visit: Payer: Self-pay

## 2019-10-12 ENCOUNTER — Ambulatory Visit (HOSPITAL_COMMUNITY): Payer: Medicare Other | Attending: Cardiology

## 2019-10-12 DIAGNOSIS — Z8249 Family history of ischemic heart disease and other diseases of the circulatory system: Secondary | ICD-10-CM | POA: Insufficient documentation

## 2019-10-12 DIAGNOSIS — I4891 Unspecified atrial fibrillation: Secondary | ICD-10-CM | POA: Insufficient documentation

## 2019-10-12 DIAGNOSIS — E785 Hyperlipidemia, unspecified: Secondary | ICD-10-CM | POA: Diagnosis not present

## 2019-10-12 DIAGNOSIS — I351 Nonrheumatic aortic (valve) insufficiency: Secondary | ICD-10-CM | POA: Insufficient documentation

## 2019-10-12 DIAGNOSIS — I10 Essential (primary) hypertension: Secondary | ICD-10-CM | POA: Diagnosis not present

## 2019-10-13 DIAGNOSIS — Z7189 Other specified counseling: Secondary | ICD-10-CM | POA: Insufficient documentation

## 2019-10-13 NOTE — Progress Notes (Addendum)
Cardiology Office Note   Date:  10/15/2019   ID:  Carlos Bailey, DOB 1951-01-29, MRN [id number]  PCP:  Crist Infante, MD  Cardiologist:   No primary care provider on file. Referring:  Crist Infante, MD  Chief Complaint  Patient presents with  . Atrial Fibrillation      History of Present Illness: Carlos Bailey is a 69 y.o. male who is referred by Crist Infante, MD for evaluation of AI.   This was moderate to severe on echo two days ago.  Since I last saw him he is actually done well.  He still working with a trainer twice a week.  He is can start swimming soon.  He has absolutely no symptoms. The patient denies any new symptoms such as chest discomfort, neck or arm discomfort. There has been no new shortness of breath, PND or orthopnea. There have been no reported palpitations, presyncope or syncope.  Interestingly he is atrial fib.  This was the first time of seeing this.  He does not feel any irregular heartbeat.   Past Medical History:  Diagnosis Date  . HTN (hypertension)   . Hyperlipemia   . Inguinal hernia    left    Past Surgical History:  Procedure Laterality Date  . BACK SURGERY    . HERNIA REPAIR     RIHR as child  . INGUINAL HERNIA REPAIR Left 07/16/2016   Procedure: OPEN LEFT INGUINAL HERNIA REPAIR WITH MESH;  Surgeon: Johnathan Hausen, MD;  Location: [facility];  Service: General;  Laterality: Left;  . INSERTION OF MESH Left 07/16/2016   Procedure: INSERTION OF MESH;  Surgeon: Johnathan Hausen, MD;  Location: [facility];  Service: General;  Laterality: Left;     Current Outpatient Medications  Medication Sig Dispense Refill  . atorvastatin (LIPITOR) 10 MG tablet Take 10 mg by mouth daily.    . butalbital-aspirin-caffeine (FIORINAL) 50-325-40 MG capsule Take 1 capsule by mouth as needed for headache.    . olmesartan (BENICAR) 20 MG tablet Take 20 mg by mouth daily.    . rivaroxaban (XARELTO) 20 MG TABS tablet Take 1  tablet (20 mg total) by mouth daily with supper. 30 tablet 11   No current facility-administered medications for this visit.    Allergies:   Codeine    ROS:  Please see the history of present illness.   Otherwise, review of systems are positive for none.   All other systems are reviewed and negative.    PHYSICAL EXAM: VS:  BP (!) 146/86   Pulse (!) 57   Ht 5' 7.5" (1.715 m)   Wt 153 lb 3.2 oz (69.5 kg)   SpO2 96%   BMI 23.64 kg/m  , BMI Body mass index is 23.64 kg/m. GENERAL:  Well appearing NECK:  No jugular venous distention, waveform within normal limits, carotid upstroke brisk and symmetric, no bruits, no thyromegaly LUNGS:  Clear to auscultation bilaterally CHEST:  Unremarkable HEART:  PMI not displaced or sustained,S1 and S2 within normal limits, no S3, no clicks, no rubs, 2 out of 6 diastolic murmur at the third left intercostal space and ending in mid diastole, irregular ABD:  Flat, positive bowel sounds normal in frequency in pitch, no bruits, no rebound, no guarding, no midline pulsatile mass, no hepatomegaly, no splenomegaly EXT:  2 plus pulses throughout, no edema, no cyanosis no clubbing    EKG:  EKG is  ordered today. The ekg ordered today demonstrates ,  left axis deviation, poor anterior R wave progression, no acute ST-T wave changes.   Recent Labs: No results found for requested labs within last 8760 hours.    Lipid Panel No results found for: CHOL, TRIG, HDL, CHOLHDL, VLDL, LDLCALC, LDLDIRECT    Wt Readings from Last 3 Encounters:  10/15/19 153 lb 3.2 oz (69.5 kg)  10/06/18 157 lb (71.2 kg)  07/16/16 143 lb 6.4 oz (65 kg)      Other studies Reviewed: Additional studies/ records that were reviewed today include: Labs, echo Review of the above records demonstrates:  Please see elsewhere in the note.     ASSESSMENT AND PLAN:  AORTIC INSUFFICIENCY:  This was moderate to severe.  He is asymptomatic.  (Class B to C1).   The EF is 55%.  I think this  is unchanged from previous.  It was most recently listed as 60% but it had been 55% previously.  I do not think this represents a significant downward trend.  It is difficult for me to quantify the AI as I do not see any of the measurements that I would find useful but I have resubmitted the schedule reevaluated.  Regardless my plan is to follow him with another echo in 6 months.  He is completely asymptomatic and I am convinced about that.  ATRIAL FIB: He is in atrial fib which is new.  He does not feel this.  I am going to start him on anticoagulation.  I am going to apply a 3-day monitor to make sure this is persistent.  I will then plan cardioversion.  He will start Xarelto.  Our pharmacist educated him on this.  ELEVATED CORONARY CALCIUM:   He had a negative adequate POET (Plain Old Exercise Treadmill) last year.  He has had no new symptoms.  We are going to pursue aggressive risk reduction.   RISK REDUCTION:  His MESA score is 15.4.  He did not want to take aspirin.  He is having his lipids followed by his primary provider.   DYSLIPIDEMIA: LDL is elevated at 128 but he has since started statin and I will defer to his primary provider with goal LDL less than 70.   HTN: His blood pressure is controlled.  No change in therapy.    Current medicines are reviewed at length with the patient today.  The patient does not have concerns regarding medicines.  The following changes have been made:  As above  Labs/ tests ordered today include:   Orders Placed This Encounter  Procedures  . CBC  . Basic metabolic panel  . Basic metabolic panel  . LONG TERM MONITOR (3-14 DAYS)  . EKG 12-Lead  . ECHOCARDIOGRAM COMPLETE     Disposition:   FU with in six months.   Signed, Rollene Rotunda, MD  10/15/2019 1:07 PM    [facility]

## 2019-10-15 ENCOUNTER — Encounter: Payer: Self-pay | Admitting: Cardiology

## 2019-10-15 ENCOUNTER — Other Ambulatory Visit: Payer: Self-pay

## 2019-10-15 ENCOUNTER — Telehealth: Payer: Self-pay | Admitting: Radiology

## 2019-10-15 ENCOUNTER — Ambulatory Visit (INDEPENDENT_AMBULATORY_CARE_PROVIDER_SITE_OTHER): Payer: Medicare Other | Admitting: Cardiology

## 2019-10-15 VITALS — BP 146/86 | HR 57 | Ht 67.5 in | Wt 153.2 lb

## 2019-10-15 DIAGNOSIS — I351 Nonrheumatic aortic (valve) insufficiency: Secondary | ICD-10-CM | POA: Diagnosis not present

## 2019-10-15 DIAGNOSIS — E785 Hyperlipidemia, unspecified: Secondary | ICD-10-CM

## 2019-10-15 DIAGNOSIS — I10 Essential (primary) hypertension: Secondary | ICD-10-CM

## 2019-10-15 DIAGNOSIS — I4891 Unspecified atrial fibrillation: Secondary | ICD-10-CM

## 2019-10-15 DIAGNOSIS — R931 Abnormal findings on diagnostic imaging of heart and coronary circulation: Secondary | ICD-10-CM | POA: Diagnosis not present

## 2019-10-15 LAB — BASIC METABOLIC PANEL
BUN/Creatinine Ratio: 13 (ref 10–24)
BUN: 10 mg/dL (ref 8–27)
CO2: 24 mmol/L (ref 20–29)
Calcium: 9.1 mg/dL (ref 8.6–10.2)
Chloride: 101 mmol/L (ref 96–106)
Creatinine, Ser: 0.75 mg/dL — ABNORMAL LOW (ref 0.76–1.27)
GFR calc Af Amer: 109 mL/min/{1.73_m2} (ref >59)
GFR calc non Af Amer: 94 mL/min/{1.73_m2} (ref >59)
Glucose: 95 mg/dL (ref 65–99)
Potassium: 3.9 mmol/L (ref 3.5–5.2)
Sodium: 137 mmol/L (ref 134–144)

## 2019-10-15 MED ORDER — RIVAROXABAN 20 MG PO TABS: 20.0000 mg | ORAL_TABLET | Freq: Every day | ORAL | 11 refills | Status: DC

## 2019-10-15 NOTE — Patient Instructions (Signed)
Medication Instructions:  Start Xarelto 20mg  daily at supper *If you need a refill on your cardiac medications before your next appointment, please call your pharmacy*  Lab Work: Your physician recommends that you return for lab work today and on Tuesday April 6th  If you have labs (blood work) drawn today and your tests are completely normal, you will receive your results only by: 03-28-1978 MyChart Message (if you have MyChart) OR . A paper copy in the mail If you have any lab test that is abnormal or we need to change your treatment, we will call you to review the results.   Testing/Procedures: Your physician has recommended that you wear an event monitor. Event monitors are medical devices that record the heart's electrical activity. Doctors most often Marland Kitchen these monitors to diagnose arrhythmias. Arrhythmias are problems with the speed or rhythm of the heartbeat. The monitor is a small, portable device. You can wear one while you do your normal daily activities. This is usually used to diagnose what is causing palpitations/syncope (passing out).  Your physician has requested that you have an echocardiogram. Echocardiography is a painless test that uses sound waves to create images of your heart. It provides your doctor with information about the size and shape of your heart and how well your heart's chambers and valves are working. This procedure takes approximately one hour. There are no restrictions for this procedure. [address]  Follow-Up: At [facility], you and your health needs are our priority.  As part of our continuing mission to provide you with exceptional heart care, we have created designated Provider Care Teams.  These Care Teams include your primary Cardiologist (physician) and Advanced Practice Providers (APPs -  Physician Assistants and Nurse Practitioners) who all work together to provide you with the care you need, when you need it.  We recommend signing  up for the patient portal called "MyChart".  Sign up information is provided on this After Visit Summary.  MyChart is used to connect with patients for Virtual Visits (Telemedicine).  Patients are able to view lab/test results, encounter notes, upcoming appointments, etc.  Non-urgent messages can be sent to your provider as well.   To learn more about what you can do with MyChart, go to [facility].    Your next appointment:   6 month(s) after Echocardiogram You will receive a reminder letter in the mail two months in advance. If you don't receive a letter, please call our office to schedule the follow-up appointment.  The format for your next appointment:   In Person  Provider:   [email or web address], MD    Other Instructions ZIO XT- Long Term Monitor Instructions   Your physician has requested you wear your ZIO patch monitor for 3 days.   This is a single patch monitor.  [employer] supplies one patch monitor per enrollment.  Additional stickers are not available.   Please do not apply patch if you will be having a Nuclear Stress Test, Echocardiogram, Cardiac CT, MRI, or Chest Xray during the time frame you would be wearing the monitor. The patch cannot be worn during these tests.  You cannot remove and re-apply the ZIO XT patch monitor.   Your ZIO patch monitor will be sent USPS Priority mail from [facility] directly to your home address. The monitor may also be mailed to a PO BOX if home delivery is not available.   It may take 3-5 days to receive your monitor after you have  been enrolled.   Once you have received you monitor, please review enclosed instructions.  Your monitor has already been registered assigning a specific monitor serial # to you.   Applying the monitor   Shave hair from upper left chest.   Hold abrader disc by orange tab.  Rub abrader in 40 strokes over left upper chest as indicated in your monitor instructions.   Clean area with 4 enclosed  alcohol pads .  Use all pads to assure are is cleaned thoroughly.  Let dry.   Apply patch as indicated in monitor instructions.  Patch will be place under collarbone on left side of chest with arrow pointing upward.   Rub patch adhesive wings for 2 minutes.Remove white label marked "1".  Remove white label marked "2".  Rub patch adhesive wings for 2 additional minutes.   While looking in a mirror, press and release button in center of patch.  A small green light will flash 3-4 times .  This will be your only indicator the monitor has been turned on.     Do not shower for the first 24 hours.  You may shower after the first 24 hours.   Press button if you feel a symptom. You will hear a small click.  Record Date, Time and Symptom in the Patient Log Book.   When you are ready to remove patch, follow instructions on last 2 pages of Patient Log Book.  Stick patch monitor onto last page of Patient Log Book.   Place Patient Log Book in [place] box.  Use locking tab on box and tape box closed securely.  The [employer] and [employer] has [employer] on it.  Please place in mailbox as soon as possible.  Your physician should have your test results approximately 7 days after the monitor has been mailed back to [facility].   Call [place] at [phone] if you have questions regarding your ZIO XT patch monitor.  Call them immediately if you see an orange light blinking on your monitor.   If your monitor falls off in less than 4 days contact our Monitor department at [phone].  If your monitor becomes loose or falls off after 4 days call [employer] at [phone] for suggestions on securing your monitor.    Dear Mr. Carlos Bailey are scheduled for a Cardioversion on Friday April 9th with Dr. Harrington Challenger.  Please arrive at the [facility] (Main Entrance A) at [facility]: [address] [place], [place] [address] at 07:00am.   DIET: Nothing to eat or drink after midnight except a  sip of water with medications (see medication instructions below)  Medication Instructions:  Continue your anticoagulant: Xarelto You will need to continue your anticoagulant after your procedure until you are told by your  Provider that it is safe to stop.   Labs: CBC, BMET within 1 week of procedure Come to: [place] for lab work on Tuesday April 6th   You are scheduled for a Covid Screening on Tuesday April 6th at 09:05am.This is a Drive Up Visit at the [employer] [address], [place]. Someone will direct you to the appropriate testing line. Stay in your car and someone will be with you shortly   You must have a responsible person to drive you home and stay in the waiting area during your procedure. Failure to do so could result in cancellation.  Bring your insurance cards.  *Special Note: Every effort is  made to have your procedure done on time. Occasionally there are emergencies that occur at the hospital that may cause delays. Please be patient if a delay does occur.

## 2019-10-15 NOTE — Telephone Encounter (Signed)
Enrolled patient for a 3 day Zio monitor to be mailed to patients home.  

## 2019-10-19 ENCOUNTER — Other Ambulatory Visit (INDEPENDENT_AMBULATORY_CARE_PROVIDER_SITE_OTHER): Payer: Medicare Other

## 2019-10-19 DIAGNOSIS — I4891 Unspecified atrial fibrillation: Secondary | ICD-10-CM

## 2019-11-23 ENCOUNTER — Other Ambulatory Visit (HOSPITAL_COMMUNITY)
Admission: RE | Admit: 2019-11-23 | Discharge: 2019-11-23 | Disposition: A | Discharge status: Home / Self Care | Payer: Medicare Other | Source: Ambulatory Visit | Attending: Internal Medicine | Admitting: Internal Medicine

## 2019-11-23 DIAGNOSIS — Z20822 Contact with and (suspected) exposure to covid-19: Secondary | ICD-10-CM | POA: Insufficient documentation

## 2019-11-23 DIAGNOSIS — Z01812 Encounter for preprocedural laboratory examination: Secondary | ICD-10-CM | POA: Insufficient documentation

## 2019-11-23 LAB — BASIC METABOLIC PANEL
BUN/Creatinine Ratio: 16 (ref 10–24)
BUN: 9 mg/dL (ref 8–27)
CO2: 23 mmol/L (ref 20–29)
Calcium: 9 mg/dL (ref 8.6–10.2)
Chloride: 99 mmol/L (ref 96–106)
Creatinine, Ser: 0.55 mg/dL — ABNORMAL LOW (ref 0.76–1.27)
GFR calc Af Amer: 124 mL/min/{1.73_m2} (ref >59)
GFR calc non Af Amer: 107 mL/min/{1.73_m2} (ref >59)
Glucose: 91 mg/dL (ref 65–99)
Potassium: 4 mmol/L (ref 3.5–5.2)
Sodium: 138 mmol/L (ref 134–144)

## 2019-11-23 LAB — CBC
Hematocrit: 38.8 % (ref 37.5–51.0)
Hemoglobin: 13.5 g/dL (ref 13.0–17.7)
MCH: 32.5 pg (ref 26.6–33.0)
MCHC: 34.8 g/dL (ref 31.5–35.7)
MCV: 93 fL (ref 79–97)
Platelets: 155 10*3/uL (ref 150–450)
RBC: 4.16 x10E6/uL (ref 4.14–5.80)
RDW: 12.4 % (ref 11.6–15.4)
WBC: 4.4 10*3/uL (ref 3.4–10.8)

## 2019-11-23 LAB — SARS CORONAVIRUS 2 (TAT 6-24 HRS): SARS Coronavirus 2: NEGATIVE

## 2019-11-25 ENCOUNTER — Encounter (HOSPITAL_COMMUNITY): Payer: Self-pay | Admitting: Internal Medicine

## 2019-11-25 NOTE — Anesthesia Preprocedure Evaluation (Addendum)
Anesthesia Evaluation  Patient identified by MRN, date of birth, ID band Patient awake    Reviewed: Allergy & Precautions, NPO status , Patient's Chart, lab work & pertinent test results  Airway Mallampati: II  TM Distance: >3 FB Neck ROM: Full    Dental no notable dental hx. (+) Teeth Intact   Pulmonary neg pulmonary ROS,    Pulmonary exam normal breath sounds clear to auscultation       Cardiovascular hypertension, Pt. on medications + CAD  + Valvular Problems/Murmurs AI  Rhythm:Irregular Rate:Normal     Neuro/Psych negative neurological ROS  negative psych ROS   GI/Hepatic negative GI ROS, Neg liver ROS,   Endo/Other  Hyperlipidemia  Renal/GU negative Renal ROS  negative genitourinary   Musculoskeletal negative musculoskeletal ROS (+)   Abdominal   Peds  Hematology Rivaroxaban- last dose yesterday   Anesthesia Other Findings   Reproductive/Obstetrics                            Anesthesia Physical Anesthesia Plan  ASA: III  Anesthesia Plan: General   Post-op Pain Management:    Induction: Intravenous  PONV Risk Score and Plan: 2 and Ondansetron and Treatment may vary due to age or medical condition  Airway Management Planned: Mask  Additional Equipment:   Intra-op Plan:   Post-operative Plan:   Informed Consent: I have reviewed the patients History and Physical, chart, labs and discussed the procedure including the risks, benefits and alternatives for the proposed anesthesia with the patient or authorized representative who has indicated his/her understanding and acceptance.     Dental advisory given  Plan Discussed with: CRNA and Surgeon  Anesthesia Plan Comments:        Anesthesia Quick Evaluation

## 2019-11-26 ENCOUNTER — Other Ambulatory Visit: Payer: Self-pay

## 2019-11-26 ENCOUNTER — Ambulatory Visit (HOSPITAL_COMMUNITY): Payer: Medicare Other | Admitting: Certified Registered Nurse Anesthetist

## 2019-11-26 ENCOUNTER — Encounter (HOSPITAL_COMMUNITY)
Admission: RE | Disposition: A | Discharge status: Home / Self Care | Payer: Self-pay | Source: Home / Self Care | Attending: Internal Medicine

## 2019-11-26 ENCOUNTER — Encounter (HOSPITAL_COMMUNITY): Payer: Self-pay | Admitting: Internal Medicine

## 2019-11-26 ENCOUNTER — Ambulatory Visit (HOSPITAL_COMMUNITY)
Admission: RE | Admit: 2019-11-26 | Discharge: 2019-11-26 | Disposition: A | Discharge status: Home / Self Care | Payer: Medicare Other | Attending: Internal Medicine | Admitting: Internal Medicine

## 2019-11-26 DIAGNOSIS — I4819 Other persistent atrial fibrillation: Secondary | ICD-10-CM | POA: Insufficient documentation

## 2019-11-26 DIAGNOSIS — Z7901 Long term (current) use of anticoagulants: Secondary | ICD-10-CM | POA: Diagnosis not present

## 2019-11-26 DIAGNOSIS — Z885 Allergy status to narcotic agent status: Secondary | ICD-10-CM | POA: Insufficient documentation

## 2019-11-26 DIAGNOSIS — E785 Hyperlipidemia, unspecified: Secondary | ICD-10-CM | POA: Diagnosis not present

## 2019-11-26 DIAGNOSIS — I10 Essential (primary) hypertension: Secondary | ICD-10-CM | POA: Insufficient documentation

## 2019-11-26 DIAGNOSIS — I4891 Unspecified atrial fibrillation: Secondary | ICD-10-CM | POA: Diagnosis not present

## 2019-11-26 DIAGNOSIS — Z79899 Other long term (current) drug therapy: Secondary | ICD-10-CM | POA: Insufficient documentation

## 2019-11-26 HISTORY — PX: CARDIOVERSION: SHX1299

## 2019-11-26 SURGERY — CARDIOVERSION
Anesthesia: General

## 2019-11-26 MED ORDER — LIDOCAINE 2% (20 MG/ML) 5 ML SYRINGE
INTRAMUSCULAR | Status: DC | PRN
  Administered 2019-11-26: 50 mg via INTRAVENOUS

## 2019-11-26 MED ORDER — PROPOFOL 10 MG/ML IV BOLUS
INTRAVENOUS | Status: DC | PRN
  Administered 2019-11-26: 50 mg via INTRAVENOUS
  Administered 2019-11-26: 40 mg via INTRAVENOUS

## 2019-11-26 MED ORDER — SODIUM CHLORIDE 0.9 % IV SOLN: INTRAVENOUS | Status: DC | PRN

## 2019-11-26 NOTE — Anesthesia Procedure Notes (Signed)
Procedure Name: General with mask airway Date/Time: 11/26/2019 8:11 AM Performed by: Nils Pyle, CRNA Pre-anesthesia Checklist: Patient identified, Emergency Drugs available, Suction available and Patient being monitored Patient Re-evaluated:Patient Re-evaluated prior to induction Oxygen Delivery Method: Ambu bag Preoxygenation: Pre-oxygenation with 100% oxygen Induction Type: IV induction Placement Confirmation: positive ETCO2 and breath sounds checked- equal and bilateral Dental Injury: Teeth and Oropharynx as per pre-operative assessment

## 2019-11-26 NOTE — Anesthesia Postprocedure Evaluation (Signed)
Anesthesia Post Note  Patient: Carlos Bailey  Procedure(s) Performed: CARDIOVERSION (N/A )     Patient location during evaluation: PACU Anesthesia Type: General Level of consciousness: awake and alert and oriented Pain management: pain level controlled Vital Signs Assessment: post-procedure vital signs reviewed and stable Respiratory status: spontaneous breathing, nonlabored ventilation and respiratory function stable Cardiovascular status: blood pressure returned to baseline and stable Postop Assessment: no apparent nausea or vomiting Anesthetic complications: no    Last Vitals:  Vitals:   11/26/19 0709 11/26/19 0823  BP: (!) 180/106 (!) 150/80  Pulse: (!) 106 70  Resp: 18 15  Temp: 37.2 C 37.2 C  SpO2: 97% 97%    Last Pain:  Vitals:   11/26/19 0823  TempSrc: Oral  PainSc: 0-No pain                 Mcclellan Demarais A.

## 2019-11-26 NOTE — Transfer of Care (Signed)
Immediate Anesthesia Transfer of Care Note  Patient: Carlos Bailey  Procedure(s) Performed: CARDIOVERSION (N/A )  Patient Location: Endoscopy Unit  Anesthesia Type:General  Level of Consciousness: awake, alert  and oriented  Airway & Oxygen Therapy: Patient Spontanous Breathing  Post-op Assessment: Report given to RN, Post -op Vital signs reviewed and stable and Patient moving all extremities X 4  Post vital signs: Reviewed and stable  Last Vitals:  Vitals Value Taken Time  BP    Temp    Pulse    Resp    SpO2      Last Pain:  Vitals:   11/26/19 0709  TempSrc: Oral  PainSc: 0-No pain         Complications: No apparent anesthesia complications

## 2019-11-26 NOTE — Discharge Instructions (Signed)
Electrical Cardioversion Electrical cardioversion is the delivery of a jolt of electricity to restore a normal rhythm to the heart. A rhythm that is too fast or is not regular keeps the heart from pumping well. In this procedure, sticky patches or metal paddles are placed on the chest to deliver electricity to the heart from a device.  Follow these instructions at home:  Do not drive for 24 hours if you were given a sedative during your procedure.  Take over-the-counter and prescription medicines only as told by your health care provider.  Ask your health care provider how to check your pulse. Check it often.  Rest for 48 hours after the procedure or as told by your health care provider.  Avoid or limit your caffeine use as told by your health care provider.  Keep all follow-up visits as told by your health care provider. This is important. Contact a health care provider if:  You feel like your heart is beating too quickly or your pulse is not regular.  You have a serious muscle cramp that does not go away. Get help right away if:  You have discomfort in your chest.  You are dizzy or you feel faint.  You have trouble breathing or you are short of breath.  Your speech is slurred.  You have trouble moving an arm or leg on one side of your body.  Your fingers or toes turn cold or blue. Summary  Electrical cardioversion is the delivery of a jolt of electricity to restore a normal rhythm to the heart.  This procedure may be done right away in an emergency or may be a scheduled procedure if the condition is not an emergency.  Generally, this is a safe procedure.  After the procedure, check your pulse often as told by your health care provider. This information is not intended to replace advice given to you by your health care provider. Make sure you discuss any questions you have with your health care provider. Document Revised: 03/08/2019 Document Reviewed: 03/08/2019 Elsevier  Patient Education  2020 Elsevier Inc.  

## 2019-11-26 NOTE — CV Procedure (Signed)
CARDIOVERSION   Patient sedated by anesthesia with 50 mg Lidocaine and 90 mg Propofol intravenously  WIth pads in AP position, patient cardioverted to SR with 200 J synchronized bipphasic energy  Procedure was without complications   12 lead EKG pending   Dietrich Pates MD

## 2019-11-26 NOTE — H&P (Signed)
Cardiology Office Note   Date:  11/26/2019   ID:  Carlos Bailey, DOB 10-19-1950, MRN [id number]  PCP:  Crist Infante, MD  Cardiologist:   Dorris Carnes, MD    Pt presents for cardioversion for afib     History of Present Illness: Carlos Bailey is a 69 y.o. male with a history of HTN and aoritc inusfficiency    HE is followed by J Hochrein   Echo with mod AI  LVEF normal   Monitor placed  Found to have afib on monitor   He is asymptoamtic   Started on Xarelto   Presents for cardioversion    Current Meds  Medication Sig  . atorvastatin (LIPITOR) 20 MG tablet Take 20 mg by mouth daily.  . butalbital-aspirin-caffeine (FIORINAL) 50-325-40 MG capsule Take 1 capsule by mouth 2 (two) times daily as needed for headache (back pain.).   Marland Kitchen Multiple Vitamin (MULTIVITAMIN WITH MINERALS) TABS tablet Take 1 tablet by mouth daily. Enhanced Energy Multivitamin  . olmesartan (BENICAR) 20 MG tablet Take 20 mg by mouth every evening.   Vladimir Faster Glycol-Propyl Glycol (LUBRICANT EYE DROPS) 0.4-0.3 % SOLN Place 1 drop into both eyes 3 (three) times daily as needed (itchy/irritated eyes.).  Marland Kitchen QUNOL COQ10/UBIQUINOL/MEGA PO Take 100 mg by mouth daily.  . rivaroxaban (XARELTO) 20 MG TABS tablet Take 1 tablet (20 mg total) by mouth daily with supper.  Marland Kitchen Specialty Vitamins Products (PROSTATE PO) Take 2 tablets by mouth in the morning and at bedtime. [employer] Prosta-Strong Red     Allergies:   Codeine   Past Medical History:  Diagnosis Date  . HTN (hypertension)   . Hyperlipemia   . Inguinal hernia    left    Past Surgical History:  Procedure Laterality Date  . BACK SURGERY    . HERNIA REPAIR     RIHR as child  . INGUINAL HERNIA REPAIR Left 07/16/2016   Procedure: OPEN LEFT INGUINAL HERNIA REPAIR WITH MESH;  Surgeon: Johnathan Hausen, MD;  Location: [facility];  Service: General;  Laterality: Left;  . INSERTION OF MESH Left 07/16/2016   Procedure: INSERTION OF MESH;   Surgeon: Johnathan Hausen, MD;  Location: [facility];  Service: General;  Laterality: Left;     Social History:  The patient  reports that he has never smoked. He has never used smokeless tobacco. He reports current alcohol use. He reports that he does not use drugs.   Family History:  The patient's family history includes Aneurysm in his mother; Diabetes Mellitus II in his mother and sister; Obesity in his sister; Stroke in his mother.    ROS:  Please see the history of present illness. All other systems are reviewed and  Negative to the above problem except as noted.    PHYSICAL EXAM: VS:  BP (!) 180/106   Pulse (!) 106   Temp 98.9 F (37.2 C) (Oral)   Resp 18   Ht 5\' 7"  (1.702 m)   Wt 71.2 kg   SpO2 97%   BMI 24.59 kg/m   GEN: Well nourished, well developed, in no acute distress  HEENT: normal  Neck: no JVD Cardiac:  Irreg rate rhythm     Gr II/VI diastolic murmur LSB ,no edema  Respiratory:  clear to auscultation bilaterally, normal work of breathing GI: soft, nontender, nondistended, + BS  No hepatomegaly  MS: no deformity Moving all extremities   Skin: warm and dry, no rash Neuro:  Strength and sensation are intact Psych: euthymic mood, full affect   EKG:  EKG is not ordered today.   Lipid Panel No results found for: CHOL, TRIG, HDL, CHOLHDL, VLDL, LDLCALC, LDLDIRECT    Wt Readings from Last 3 Encounters:  11/26/19 71.2 kg  10/15/19 69.5 kg  10/06/18 71.2 kg      ASSESSMENT AND PLAN:  Atrial fibrillation   PT found to be in atrial fibrillation on monitor   Persistent     He has been on Xarelto and has not missed any doses.    I discussed procedure with him for cardioversion    Pt understands and agrees to proceed.     Current medicines are reviewed at length with the patient today.  The patient does not have concerns regarding medicines.  Signed, Dietrich Pates, MD  11/26/2019 7:56 AM    [facility] [address] [place],  [place], [place]  [address] Phone: [phone]; Fax: [phone]

## 2020-01-14 ENCOUNTER — Encounter: Payer: Self-pay | Admitting: Podiatry

## 2020-01-14 ENCOUNTER — Other Ambulatory Visit: Payer: Self-pay

## 2020-01-14 ENCOUNTER — Ambulatory Visit (INDEPENDENT_AMBULATORY_CARE_PROVIDER_SITE_OTHER): Payer: Medicare Other | Admitting: Podiatry

## 2020-01-14 ENCOUNTER — Ambulatory Visit (INDEPENDENT_AMBULATORY_CARE_PROVIDER_SITE_OTHER): Payer: Medicare Other

## 2020-01-14 DIAGNOSIS — R600 Localized edema: Secondary | ICD-10-CM | POA: Diagnosis not present

## 2020-01-14 DIAGNOSIS — S9002XA Contusion of left ankle, initial encounter: Secondary | ICD-10-CM | POA: Diagnosis not present

## 2020-01-14 DIAGNOSIS — R931 Abnormal findings on diagnostic imaging of heart and coronary circulation: Secondary | ICD-10-CM

## 2020-01-14 NOTE — Patient Instructions (Signed)
Ankle Sprain  An ankle sprain is a stretch or tear in a ligament in the ankle. Ligaments are tissues that connect bones to each other. The two most common types of ankle sprains are:  Inversion sprain. This happens when the foot turns inward and the ankle rolls outward. It affects the ligament on the outside of the foot (lateral ligament).  Eversion sprain. This happens when the foot turns outward and the ankle rolls inward. It affects the ligament on the inner side of the foot (medial ligament). What are the causes? This condition is often caused by accidentally rolling or twisting the ankle. What increases the risk? You are more likely to develop this condition if you play sports. What are the signs or symptoms? Symptoms of this condition include:  Pain in your ankle.  Swelling.  Bruising. This may develop right after you sprain your ankle or 1-2 days later.  Trouble standing or walking, especially when you turn or change directions. How is this diagnosed? This condition is diagnosed with:  A physical exam. During the exam, your health care provider will press on certain parts of your foot and ankle and try to move them in certain ways.  X-ray imaging. These may be taken to see how severe the sprain is and to check for broken bones. How is this treated? This condition may be treated with:  A brace or splint. This is used to keep the ankle from moving until it heals.  An elastic bandage. This is used to support the ankle.  Crutches.  Pain medicine.  Surgery. This may be needed if the sprain is severe.  Physical therapy. This may help to improve the range of motion in the ankle. Follow these instructions at home: If you have a brace or a splint:  Wear the brace or splint as told by your health care provider. Remove it only as told by your health care provider.  Loosen the brace or splint if your toes tingle, become numb, or turn cold and blue.  Keep the brace or  splint clean.  If the brace or splint is not waterproof: ? Do not let it get wet. ? Cover it with a watertight covering when you take a bath or a shower. If you have an elastic bandage (dressing):  Remove it to shower or bathe.  Try not to move your ankle much, but wiggle your toes from time to time. This helps to prevent swelling.  Adjust the dressing to make it more comfortable if it feels too tight.  Loosen the dressing if you have numbness or tingling in your foot, or if your foot becomes cold and blue. Managing pain, stiffness, and swelling   Take over-the-counter and prescription medicines only as told by your health care provider.  For 2-3 days, keep your ankle raised (elevated) above the level of your heart as much as possible.  If directed, put ice on the injured area: ? If you have a removable brace or splint, remove it as told by your health care provider. ? Put ice in a plastic bag. ? Place a towel between your skin and the bag. ? Leave the ice on for 20 minutes, 2-3 times a day. General instructions  Rest your ankle.  Do not use the injured limb to support your body weight until your health care provider says that you can. Use crutches as told by your health care provider.  Do not use any products that contain nicotine or tobacco, such as   cigarettes, e-cigarettes, and chewing tobacco. If you need help quitting, ask your health care provider.  Keep all follow-up visits as told by your health care provider. This is important. Contact a health care provider if:  You have rapidly increasing bruising or swelling.  Your pain is not relieved with medicine. Get help right away if:  Your foot or toes become numb or blue.  You have severe pain that gets worse. Summary  An ankle sprain is a stretch or tear in a ligament in the ankle. Ligaments are tissues that connect bones to each other.  This condition is often caused by accidentally rolling or twisting the  ankle.  Symptoms include pain, swelling, bruising, and trouble walking.  To relieve pain and swelling, put ice on the affected ankle, raise your ankle above the level of your heart, and use an elastic bandage.  Keep all follow-up visits as told by your health care provider. This is important. This information is not intended to replace advice given to you by your health care provider. Make sure you discuss any questions you have with your health care provider. Document Revised: 04/27/2018 Document Reviewed: 12/30/2017 Elsevier Patient Education  2020 Elsevier Inc.  

## 2020-01-14 NOTE — Progress Notes (Signed)
Subjective:   Patient ID: Carlos Bailey, male   DOB: 69 y.o.   MRN: [id number]   HPI Patient presents stating is had a severe ankle sprain left today and its been very swelling is having trouble walking or bearing any formal weight on his foot and is concerned about fracture.  It is broken in 1978 is not getting troubles it patient does not smoke likes to be active   Review of Systems  All other systems reviewed and are negative.       Objective:  Physical Exam Vitals and nursing note reviewed.  Constitutional:      Appearance: He is well-developed.  Pulmonary:     Effort: Pulmonary effort is normal.  Musculoskeletal:        General: Normal range of motion.  Skin:    General: Skin is warm.  Neurological:     Mental Status: He is alert.     Neurovascular status was found to be intact with significant edema in the left midfoot ankle with exquisite discomfort in the lateral anterior talofibular ligament.  I was unable to really evaluate motion due to the pain he is and as he is splinting and he is unable to bear weight on it currently.  Patient has good digital perfusion noted     Assessment:  Severe ankle sprain left with edema noted and pain inability to bear weight     Plan:  H&P x-rays reviewed and at this point I carefully applied Unna boot and I told him to leave it on for 3 days but if there should be any pain or change in digital color to take it off immediately.  I then advised on elevation and I did dispense air fracture walker to immobilize it to allow him to hopefully bear some weight on it.  Patient will be seen back and was encouraged to call with questions concerns which may arise  Multiple views of the left ankle negative for signs of fracture or diastases injury

## 2020-01-15 ENCOUNTER — Emergency Department (HOSPITAL_COMMUNITY): Payer: Medicare Other

## 2020-01-15 ENCOUNTER — Encounter (HOSPITAL_COMMUNITY): Payer: Self-pay | Admitting: *Deleted

## 2020-01-15 ENCOUNTER — Emergency Department (HOSPITAL_COMMUNITY)
Admission: EM | Admit: 2020-01-15 | Discharge: 2020-01-16 | Discharge status: Against Medical Advice | Payer: Medicare Other | Attending: Emergency Medicine | Admitting: Emergency Medicine

## 2020-01-15 ENCOUNTER — Other Ambulatory Visit: Payer: Self-pay

## 2020-01-15 DIAGNOSIS — M79672 Pain in left foot: Secondary | ICD-10-CM | POA: Insufficient documentation

## 2020-01-15 DIAGNOSIS — Z5321 Procedure and treatment not carried out due to patient leaving prior to being seen by health care provider: Secondary | ICD-10-CM | POA: Diagnosis not present

## 2020-01-15 NOTE — ED Triage Notes (Signed)
The pt missed a strp and twisted his ankle yesterday  He went to a foot center and they wrapped it up severe brruising both lt foot and ankle  Much bruising  He takes a blood thinner

## 2020-01-16 NOTE — ED Notes (Signed)
Security came up to this NT to say that this patient told him he was leaving. Patient seen wheeling himself out of lobby.

## 2020-02-10 ENCOUNTER — Encounter: Payer: Self-pay | Admitting: Orthopedic Surgery

## 2020-02-10 ENCOUNTER — Ambulatory Visit (INDEPENDENT_AMBULATORY_CARE_PROVIDER_SITE_OTHER): Payer: Medicare Other | Admitting: Orthopedic Surgery

## 2020-02-10 DIAGNOSIS — L97311 Non-pressure chronic ulcer of right ankle limited to breakdown of skin: Secondary | ICD-10-CM

## 2020-02-10 DIAGNOSIS — R931 Abnormal findings on diagnostic imaging of heart and coronary circulation: Secondary | ICD-10-CM | POA: Diagnosis not present

## 2020-02-10 NOTE — Progress Notes (Signed)
Office Visit Note   Patient: Carlos Bailey           Date of Birth: 05-29-1951           MRN: [id number] Visit Date: 02/10/2020              Requested by: Rodrigo Ran, MD [address] [place],  [place] [address] PCP: Rodrigo Ran, MD  No chief complaint on file.     HPI: Patient is a 69 year old gentleman who is seen for initial evaluation of a extensive blister and ulcer over the dorsal lateral aspect of the left foot and ankle.  Patient sustained a grade 3 ankle sprain with bony avulsions on May 28 he was initially casted and is currently in a fracture boot.  Due to the patient's anticoagulation therapy with Xarelto patient has developed blisters and ulceration.  Assessment & Plan: Visit Diagnoses:  1. Non-pressure chronic ulcer of right ankle limited to breakdown of skin (HCC)     Plan: Patient was placed in the [employer] medical compression stocking he will change this daily washing his foot with soap and water continue with protected weightbearing with the fracture boot.  Will follow up in 2 weeks.  Follow-Up Instructions: Return in about 2 weeks (around 02/24/2020).   Ortho Exam  Patient is alert, oriented, no adenopathy, well-dressed, normal affect, normal respiratory effort. Examination patient has a good dorsalis pedis and posterior tibial pulse he has extensive ulceration and blistering over the dorsal lateral aspect of the foot and ankle secondary to his anticoagulation therapy with Xarelto.  He is status post cardiac ablation for atrial fibrillation.  There is no angular deformity of the ankle there is no cellulitis.  Imaging: No results found. No images are attached to the encounter.  Labs: No results found for: HGBA1C, ESRSEDRATE, CRP, LABURIC, REPTSTATUS, GRAMSTAIN, CULT, LABORGA   No results found for: ALBUMIN, PREALBUMIN, LABURIC  No results found for: MG No results found for: VD25OH  No results found for: PREALBUMIN CBC EXTENDED Latest Ref Rng &  Units 11/23/2019  WBC 3.4 - 10.8 x10E3/uL 4.4  RBC 4.14 - 5.80 x10E6/uL 4.16  HGB 13.0 - 17.7 g/dL 18.8  HCT 41.6 - 60.6 % 38.8  PLT 150 - 450 x10E3/uL 155     There is no height or weight on file to calculate BMI.  Orders:  No orders of the defined types were placed in this encounter.  No orders of the defined types were placed in this encounter.    Procedures: No procedures performed  Clinical Data: No additional findings.  ROS:  All other systems negative, except as noted in the HPI. Review of Systems  Objective: Vital Signs: There were no vitals taken for this visit.  Specialty Comments:  No specialty comments available.  PMFS History: Patient Active Problem List   Diagnosis Date Noted  . Educated about COVID-19 virus infection 10/13/2019  . Aortic valve regurgitation 10/06/2018  . Elevated coronary artery calcium score 10/06/2018  . Essential hypertension 10/06/2018  . Dyslipidemia 10/06/2018   Past Medical History:  Diagnosis Date  . HTN (hypertension)   . Hyperlipemia   . Inguinal hernia    left    Family History  Problem Relation Age of Onset  . Aneurysm Mother   . Diabetes Mellitus II Mother   . Stroke Mother   . Obesity Sister   . Diabetes Mellitus II Sister     Past Surgical History:  Procedure Laterality Date  . BACK SURGERY    .  CARDIOVERSION N/A 11/26/2019   Procedure: CARDIOVERSION;  Surgeon: Fay Records, MD;  Location: [facility];  Service: Cardiovascular;  Laterality: N/A;  . [place] as child  . INGUINAL HERNIA REPAIR Left 07/16/2016   Procedure: OPEN LEFT INGUINAL HERNIA REPAIR WITH MESH;  Surgeon: Johnathan Hausen, MD;  Location: [facility];  Service: General;  Laterality: Left;  . INSERTION OF MESH Left 07/16/2016   Procedure: INSERTION OF MESH;  Surgeon: Johnathan Hausen, MD;  Location: [facility];  Service: General;  Laterality: Left;   Social History   Occupational History  . Not  on file  Tobacco Use  . Smoking status: Never Smoker  . Smokeless tobacco: Never Used  Substance and Sexual Activity  . Alcohol use: Yes    Comment: social  . Drug use: No  . Sexual activity: Not on file

## 2020-02-24 ENCOUNTER — Encounter: Payer: Self-pay | Admitting: Orthopedic Surgery

## 2020-02-24 ENCOUNTER — Encounter (HOSPITAL_BASED_OUTPATIENT_CLINIC_OR_DEPARTMENT_OTHER): Payer: Medicare Other | Admitting: Internal Medicine

## 2020-02-24 ENCOUNTER — Other Ambulatory Visit: Payer: Self-pay

## 2020-02-24 ENCOUNTER — Ambulatory Visit (INDEPENDENT_AMBULATORY_CARE_PROVIDER_SITE_OTHER): Payer: Medicare Other | Admitting: Orthopedic Surgery

## 2020-02-24 VITALS — Ht 68.0 in | Wt 150.0 lb

## 2020-02-24 DIAGNOSIS — R931 Abnormal findings on diagnostic imaging of heart and coronary circulation: Secondary | ICD-10-CM | POA: Diagnosis not present

## 2020-02-24 DIAGNOSIS — L97311 Non-pressure chronic ulcer of right ankle limited to breakdown of skin: Secondary | ICD-10-CM | POA: Diagnosis not present

## 2020-02-25 ENCOUNTER — Encounter: Payer: Self-pay | Admitting: Orthopedic Surgery

## 2020-02-25 NOTE — Progress Notes (Signed)
Office Visit Note   Patient: Carlos Bailey           Date of Birth: 12-05-1950           MRN: [id number] Visit Date: 02/24/2020              Requested by: Rodrigo Ran, MD [address] [place],  [place] [address] PCP: Rodrigo Ran, MD  Chief Complaint  Patient presents with  . Right Ankle - Routine Post Op      HPI: Patient is a 69 year old gentleman who is currently wearing a knee-high compression stocking using crutches brace in a postoperative shoe.  Patient states that the swelling has decreased significantly with using the compression stocking.  Assessment & Plan: Visit Diagnoses:  1. Non-pressure chronic ulcer of right ankle limited to breakdown of skin (HCC)     Plan: Recommended patient advance to a size medium compression stocking.  Follow-Up Instructions: Return in about 3 weeks (around 03/16/2020).   Ortho Exam  Patient is alert, oriented, no adenopathy, well-dressed, normal affect, normal respiratory effort. Examination the blistering is improving in the right lower extremity there is a small open wound dorsal lateral of the foot.  This is 5 mm in diameter with some small draining hematoma.  Patient has a good dorsalis pedis pulse.  Imaging: No results found. No images are attached to the encounter.  Labs: No results found for: HGBA1C, ESRSEDRATE, CRP, LABURIC, REPTSTATUS, GRAMSTAIN, CULT, LABORGA   No results found for: ALBUMIN, PREALBUMIN, LABURIC  No results found for: MG No results found for: VD25OH  No results found for: PREALBUMIN CBC EXTENDED Latest Ref Rng & Units 11/23/2019  WBC 3.4 - 10.8 x10E3/uL 4.4  RBC 4.14 - 5.80 x10E6/uL 4.16  HGB 13.0 - 17.7 g/dL 62.1  HCT 30.8 - 65.7 % 38.8  PLT 150 - 450 x10E3/uL 155     Body mass index is 22.81 kg/m.  Orders:  No orders of the defined types were placed in this encounter.  No orders of the defined types were placed in this encounter.    Procedures: No procedures  performed  Clinical Data: No additional findings.  ROS:  All other systems negative, except as noted in the HPI. Review of Systems  Objective: Vital Signs: Ht 5\' 8"  (1.727 m)   Wt 150 lb (68 kg)   BMI 22.81 kg/m   Specialty Comments:  No specialty comments available.  PMFS History: Patient Active Problem List   Diagnosis Date Noted  . Educated about COVID-19 virus infection 10/13/2019  . Aortic valve regurgitation 10/06/2018  . Elevated coronary artery calcium score 10/06/2018  . Essential hypertension 10/06/2018  . Dyslipidemia 10/06/2018   Past Medical History:  Diagnosis Date  . HTN (hypertension)   . Hyperlipemia   . Inguinal hernia    left    Family History  Problem Relation Age of Onset  . Aneurysm Mother   . Diabetes Mellitus II Mother   . Stroke Mother   . Obesity Sister   . Diabetes Mellitus II Sister     Past Surgical History:  Procedure Laterality Date  . BACK SURGERY    . CARDIOVERSION N/A 11/26/2019   Procedure: CARDIOVERSION;  Surgeon: 01/26/2020, MD;  Location: [facility];  Service: Cardiovascular;  Laterality: N/A;  . HERNIA REPAIR     [place] as child  . INGUINAL HERNIA REPAIR Left 07/16/2016   Procedure: OPEN LEFT INGUINAL HERNIA REPAIR WITH MESH;  Surgeon: 07/18/2016, MD;  Location:  [facility];  Service: General;  Laterality: Left;  . INSERTION OF MESH Left 07/16/2016   Procedure: INSERTION OF MESH;  Surgeon: Luretha Murphy, MD;  Location: [facility];  Service: General;  Laterality: Left;   Social History   Occupational History  . Not on file  Tobacco Use  . Smoking status: Never Smoker  . Smokeless tobacco: Never Used  Substance and Sexual Activity  . Alcohol use: Yes    Comment: social  . Drug use: No  . Sexual activity: Not on file

## 2020-03-01 ENCOUNTER — Telehealth: Payer: Self-pay | Admitting: Cardiology

## 2020-03-01 NOTE — Telephone Encounter (Signed)
LVM  For patient to return call to get appointment scheduled with Hochrein from the recall lists

## 2020-03-16 ENCOUNTER — Encounter: Payer: Self-pay | Admitting: Orthopedic Surgery

## 2020-03-16 ENCOUNTER — Ambulatory Visit (INDEPENDENT_AMBULATORY_CARE_PROVIDER_SITE_OTHER): Payer: Medicare Other | Admitting: Physician Assistant

## 2020-03-16 VITALS — Ht 68.0 in | Wt 150.0 lb

## 2020-03-16 DIAGNOSIS — L97311 Non-pressure chronic ulcer of right ankle limited to breakdown of skin: Secondary | ICD-10-CM

## 2020-03-16 NOTE — Progress Notes (Signed)
Office Visit Note   Patient: Carlos Bailey           Date of Birth: Jul 26, 1951           MRN: [id number] Visit Date: 03/16/2020              Requested by: Rodrigo Ran, MD [address] [place],  [place] [address] PCP: Rodrigo Ran, MD  Chief Complaint  Patient presents with  . Left Ankle - Follow-up      HPI: This is a pleasant gentleman who follows up for his left ankle.  He has been weightbearing and wearing a compression sock and a brace.  He feels he is improving and just has 1 small area.  He does have some color discoloration which she attributes to the Xarelto.  He is hoping to get off this in the near future and will discuss this with her cardiologist  Assessment & Plan: Visit Diagnoses: No diagnosis found.  Plan: Follow-up in 1 month.  I did suggest to him to get a pair of tennis shoes that would fit him for now.  This would be much safer and more amenable to physical therapy than the cast shoe  Follow-Up Instructions: No follow-ups on file.   Ortho Exam  Patient is alert, oriented, no adenopathy, well-dressed, normal affect, normal respiratory effort. Focused examination demonstrates some mild discoloration improved with elevation distal CMS is intact mild soft tissue swelling he has 1 area of opening on the ankle which is about 2 mm in diameter.  There is no foul odor just a small amount of serous drainage.  No cellulitis no tenderness no signs of acute infection  Imaging: No results found. No images are attached to the encounter.  Labs: No results found for: HGBA1C, ESRSEDRATE, CRP, LABURIC, REPTSTATUS, GRAMSTAIN, CULT, LABORGA   No results found for: ALBUMIN, PREALBUMIN, LABURIC  No results found for: MG No results found for: VD25OH  No results found for: PREALBUMIN CBC EXTENDED Latest Ref Rng & Units 11/23/2019  WBC 3.4 - 10.8 x10E3/uL 4.4  RBC 4.14 - 5.80 x10E6/uL 4.16  HGB 13.0 - 17.7 g/dL 60.7  HCT 37.1 - 06.2 % 38.8  PLT 150 - 450 x10E3/uL  155     Body mass index is 22.81 kg/m.  Orders:  No orders of the defined types were placed in this encounter.  No orders of the defined types were placed in this encounter.    Procedures: No procedures performed  Clinical Data: No additional findings.  ROS:  All other systems negative, except as noted in the HPI. Review of Systems  Objective: Vital Signs: Ht 5\' 8"  (1.727 m)   Wt 150 lb (68 kg)   BMI 22.81 kg/m   Specialty Comments:  No specialty comments available.  PMFS History: Patient Active Problem List   Diagnosis Date Noted  . Educated about COVID-19 virus infection 10/13/2019  . Aortic valve regurgitation 10/06/2018  . Elevated coronary artery calcium score 10/06/2018  . Essential hypertension 10/06/2018  . Dyslipidemia 10/06/2018   Past Medical History:  Diagnosis Date  . HTN (hypertension)   . Hyperlipemia   . Inguinal hernia    left    Family History  Problem Relation Age of Onset  . Aneurysm Mother   . Diabetes Mellitus II Mother   . Stroke Mother   . Obesity Sister   . Diabetes Mellitus II Sister     Past Surgical History:  Procedure Laterality Date  . BACK SURGERY    .  CARDIOVERSION N/A 11/26/2019   Procedure: CARDIOVERSION;  Surgeon: Pricilla Riffle, MD;  Location: [facility];  Service: Cardiovascular;  Laterality: N/A;  . HERNIA REPAIR     [place] as child  . INGUINAL HERNIA REPAIR Left 07/16/2016   Procedure: OPEN LEFT INGUINAL HERNIA REPAIR WITH MESH;  Surgeon: Luretha Murphy, MD;  Location: [facility];  Service: General;  Laterality: Left;  . INSERTION OF MESH Left 07/16/2016   Procedure: INSERTION OF MESH;  Surgeon: Luretha Murphy, MD;  Location: [facility];  Service: General;  Laterality: Left;   Social History   Occupational History  . Not on file  Tobacco Use  . Smoking status: Never Smoker  . Smokeless tobacco: Never Used  Substance and Sexual Activity  . Alcohol use: Yes    Comment: social    . Drug use: No  . Sexual activity: Not on file

## 2020-04-11 ENCOUNTER — Encounter: Payer: Self-pay | Admitting: Orthopedic Surgery

## 2020-04-11 ENCOUNTER — Ambulatory Visit (INDEPENDENT_AMBULATORY_CARE_PROVIDER_SITE_OTHER): Payer: Medicare Other | Admitting: Physician Assistant

## 2020-04-11 DIAGNOSIS — R931 Abnormal findings on diagnostic imaging of heart and coronary circulation: Secondary | ICD-10-CM

## 2020-04-11 DIAGNOSIS — L97311 Non-pressure chronic ulcer of right ankle limited to breakdown of skin: Secondary | ICD-10-CM

## 2020-04-11 NOTE — Progress Notes (Signed)
Office Visit Note   Patient: Carlos Bailey           Date of Birth: Jan 22, 1951           MRN: [id number] Visit Date: 04/11/2020              Requested by: Rodrigo Ran, MD [address] [place],  [place] [address] PCP: Rodrigo Ran, MD  No chief complaint on file.     HPI: This is a pleasant gentleman he is here to follow-up on his left ankle ulcer.  He has been wearing his compression sock.  He feels well.  He has not had any drainage in over 2 weeks.  He denies any pain.  He has been working with a Psychologist, educational.  He would like to get back in the pool when appropriate  Assessment & Plan: Visit Diagnoses: No diagnosis found.  Plan: Patient will continue to use his sock.  As he has had no drainage in 2 weeks I told him if he had no more in another 2 weeks he could get back in the pool he will follow-up as needed  Follow-Up Instructions: No follow-ups on file.   Ortho Exam  Patient is alert, oriented, no adenopathy, well-dressed, normal affect, normal respiratory effort. Focused examination of his left ankle demonstrates mild soft tissue swelling no cellulitis he has a 2 mm eschar.  I cannot express any fluid.  Over the lateral side of his left ankle.  No evidence of any infection no foul odor no ascending cellulitis  Imaging: No results found. No images are attached to the encounter.  Labs: No results found for: HGBA1C, ESRSEDRATE, CRP, LABURIC, REPTSTATUS, GRAMSTAIN, CULT, LABORGA   No results found for: ALBUMIN, PREALBUMIN, LABURIC  No results found for: MG No results found for: VD25OH  No results found for: PREALBUMIN CBC EXTENDED Latest Ref Rng & Units 11/23/2019  WBC 3.4 - 10.8 x10E3/uL 4.4  RBC 4.14 - 5.80 x10E6/uL 4.16  HGB 13.0 - 17.7 g/dL 10.2  HCT 72.5 - 36.6 % 38.8  PLT 150 - 450 x10E3/uL 155     There is no height or weight on file to calculate BMI.  Orders:  No orders of the defined types were placed in this encounter.  No orders of the defined  types were placed in this encounter.    Procedures: No procedures performed  Clinical Data: No additional findings.  ROS:  All other systems negative, except as noted in the HPI. Review of Systems  Objective: Vital Signs: There were no vitals taken for this visit.  Specialty Comments:  No specialty comments available.  PMFS History: Patient Active Problem List   Diagnosis Date Noted  . Educated about COVID-19 virus infection 10/13/2019  . Aortic valve regurgitation 10/06/2018  . Elevated coronary artery calcium score 10/06/2018  . Essential hypertension 10/06/2018  . Dyslipidemia 10/06/2018   Past Medical History:  Diagnosis Date  . HTN (hypertension)   . Hyperlipemia   . Inguinal hernia    left    Family History  Problem Relation Age of Onset  . Aneurysm Mother   . Diabetes Mellitus II Mother   . Stroke Mother   . Obesity Sister   . Diabetes Mellitus II Sister     Past Surgical History:  Procedure Laterality Date  . BACK SURGERY    . CARDIOVERSION N/A 11/26/2019   Procedure: CARDIOVERSION;  Surgeon: Pricilla Riffle, MD;  [facility];  Service: Cardiovascular;  Laterality: N/A;  .  HERNIA REPAIR     [place] as child  . INGUINAL HERNIA REPAIR Left 07/16/2016   Procedure: OPEN LEFT INGUINAL HERNIA REPAIR WITH MESH;  Surgeon: Luretha Murphy, MD;  Location: [facility];  Service: General;  Laterality: Left;  . INSERTION OF MESH Left 07/16/2016   Procedure: INSERTION OF MESH;  Surgeon: Luretha Murphy, MD;  Location: [facility];  Service: General;  Laterality: Left;   Social History   Occupational History  . Not on file  Tobacco Use  . Smoking status: Never Smoker  . Smokeless tobacco: Never Used  Substance and Sexual Activity  . Alcohol use: Yes    Comment: social  . Drug use: No  . Sexual activity: Not on file

## 2020-04-13 ENCOUNTER — Ambulatory Visit (HOSPITAL_COMMUNITY): Payer: Medicare Other | Attending: Cardiology

## 2020-04-13 ENCOUNTER — Other Ambulatory Visit: Payer: Self-pay

## 2020-04-13 DIAGNOSIS — I351 Nonrheumatic aortic (valve) insufficiency: Secondary | ICD-10-CM | POA: Insufficient documentation

## 2020-04-13 LAB — ECHOCARDIOGRAM COMPLETE
MV M vel: 4.74 m/s
MV Peak grad: 89.9 mmHg
P 1/2 time: 337 msec
Radius: 0.6 cm
S' Lateral: 3.55 cm

## 2020-04-25 ENCOUNTER — Telehealth: Payer: Self-pay | Admitting: Physician Assistant

## 2020-04-25 NOTE — Telephone Encounter (Signed)
New Message:    Per message in My- Chart, pt needs a letter for being out of work in August please. He would like for you to send it through My-[email or web address]

## 2020-04-25 NOTE — Telephone Encounter (Signed)
Spoke to patient he was calling to find out echo results from 8/26.Advised I will send message to Dr.Hochrein.

## 2020-04-26 NOTE — H&P (View-Only) (Signed)
  Cardiology Office Note   Date:  04/28/2020   ID:  Griffen A Iser, DOB 04/17/1951, MRN [id number]  PCP:  Perini, Mark, MD  Cardiologist:   No primary care provider on file. Referring:  Perini, Mark, MD  Chief Complaint  Patient presents with  . Atrial Fibrillation      History of Present Illness: Carlos Bailey is a 69 y.o. male who is referred by Perini, Mark, MD for evaluation of AI.   This was moderate to severe on echo two days ago.  The EF appeared to be about 50%.  I reviewed this result with Dr. Acharya and Dr. Owen.  The plan was to check a TEE as this is an eccentric jet and we can further quantify with TEE.  I called to discuss this result with him yesterday.   He was in atrial fib when I saw him last.   I started him on Xarelto.  He did have cardioversion but now is back in fibrillation.  He feels well.  He denies any cardiovascular symptoms.  He has had no new shortness of breath, PND or orthopnea.  Has had no palpitations that he can notice and has had no presyncope or syncope.   Past Medical History:  Diagnosis Date  . HTN (hypertension)   . Hyperlipemia   . Inguinal hernia    left    Past Surgical History:  Procedure Laterality Date  . BACK SURGERY    . CARDIOVERSION N/A 11/26/2019   Procedure: CARDIOVERSION;  Surgeon: Ross, Paula V, MD;  Location: [facility];  Service: Cardiovascular;  Laterality: N/A;  . HERNIA REPAIR     RIHR as child  . INGUINAL HERNIA REPAIR Left 07/16/2016   Procedure: OPEN LEFT INGUINAL HERNIA REPAIR WITH MESH;  Surgeon: Matthew Martin, MD;  Location: [facility];  Service: General;  Laterality: Left;  . INSERTION OF MESH Left 07/16/2016   Procedure: INSERTION OF MESH;  Surgeon: Matthew Martin, MD;  Location: [facility];  Service: General;  Laterality: Left;     Current Outpatient Medications  Medication Sig Dispense Refill  . atorvastatin (LIPITOR) 20 MG tablet Take 20 mg by mouth daily.     . butalbital-aspirin-caffeine (FIORINAL) 50-325-40 MG capsule Take 1 capsule by mouth 2 (two) times daily as needed for headache (back pain.).     . Multiple Vitamin (MULTIVITAMIN WITH MINERALS) TABS tablet Take 1 tablet by mouth daily. Enhanced Energy Multivitamin    . olmesartan (BENICAR) 40 MG tablet Take 1 tablet (40 mg total) by mouth every evening. 30 tablet 3  . QUNOL COQ10/UBIQUINOL/MEGA PO Take 100 mg by mouth daily.    . rivaroxaban (XARELTO) 20 MG TABS tablet Take 1 tablet (20 mg total) by mouth daily with supper. 30 tablet 11  . Specialty Vitamins Products (PROSTATE PO) Take 2 tablets by mouth as needed. [employer] Prosta-Strong Red      No current facility-administered medications for this visit.    Allergies:   Codeine    ROS:  Please see the history of present illness.   Otherwise, review of systems are positive for none.   All other systems are reviewed and negative.    PHYSICAL EXAM: VS:  BP (!) 142/102   Pulse 84  , BMI There is no height or weight on file to calculate BMI. GENERAL:  Well appearing NECK:  No jugular venous distention, waveform within normal limits, carotid upstroke brisk and symmetric, no bruits, no thyromegaly LUNGS:    Clear to auscultation bilaterally CHEST:  Unremarkable HEART:  PMI not displaced or sustained,S1 and S2 within normal limits, no S3, no S4, no clicks, no rubs, 2 out of 6 brief diastolic murmur, 2 out of 6 soft early peaking systolic murmurs ABD:  Flat, positive bowel sounds normal in frequency in pitch, no bruits, no rebound, no guarding, no midline pulsatile mass, no hepatomegaly, no splenomegaly EXT:  2 plus pulses throughout, no edema, no cyanosis no clubbing   EKG:  EKG is  ordered today. The ekg ordered today demonstrates atrial fibrillation, rate 84, premature ventricular contractions with bigeminy, left axis deviation, poor anterior R wave progression, no acute ST-T wave changes.   Recent Labs: 04/27/2020: BUN 12;  Creatinine, Ser 0.62; Hemoglobin 14.1; Platelets 160; Potassium 4.0; Sodium 137    Lipid Panel No results found for: CHOL, TRIG, HDL, CHOLHDL, VLDL, LDLCALC, LDLDIRECT    Wt Readings from Last 3 Encounters:  03/16/20 150 lb (68 kg)  02/24/20 150 lb (68 kg)  01/15/20 150 lb (68 kg)      Other studies Reviewed: Additional studies/ records that were reviewed today include: TTE Review of the above records demonstrates:  Please see elsewhere in the note.     ASSESSMENT AND PLAN:  AORTIC INSUFFICIENCY:  This was moderate to severe.  He is asymptomatic.  (Class B to C1).   The EF is 50%.   Because it is an eccentric jet and the EF is low normal I am planning a TEE.     ATRIAL FIB:    He has recurrent atrial fibrillation.  He has absolutely no symptoms related to this.  He has good rate control.  He is tolerating anticoagulation.  I plan to pursue rate control and anticoagulation.   ELEVATED CORONARY CALCIUM:   He had a negative adequate POET (Plain Old Exercise Treadmill) last year.  He has no symptoms.  He will continue with risk reduction.  HTN: His blood pressure is not controlled and I am going to increase his olmesartan to 40 mg daily.  RISK REDUCTION:  His MESA score is 15.4.   DYSLIPIDEMIA: LDL is elevated 89 but this is much better than before.  He is having this managed by Perini, Mark, MD   COVID EDUCATION: He has been vaccinated.  Current medicines are reviewed at length with the patient today.  The patient does not have concerns regarding medicines.  The following changes have been made:  As above  Labs/ tests ordered today include:   Orders Placed This Encounter  Procedures  . CBC with Differential/Platelet  . Basic metabolic panel  . EKG 12-Lead     Disposition:   FU with in six months.   Signed, Mialee Weyman, MD  04/28/2020 9:56 AM    [facility]  

## 2020-04-26 NOTE — Progress Notes (Signed)
Cardiology Office Note   Date:  04/28/2020   ID:  Carlos Bailey, DOB 02-03-1951, MRN [id number]  PCP:  Rodrigo Ran, MD  Cardiologist:   No primary care provider on file. Referring:  Rodrigo Ran, MD  Chief Complaint  Patient presents with  . Atrial Fibrillation      History of Present Illness: Carlos Bailey is a 69 y.o. male who is referred by Rodrigo Ran, MD for evaluation of AI.   This was moderate to severe on echo two days ago.  The EF appeared to be about 50%.  I reviewed this result with Dr. Jacques Navy and Dr. Cornelius Moras.  The plan was to check a TEE as this is an eccentric jet and we can further quantify with TEE.  I called to discuss this result with him yesterday.   He was in atrial fib when I saw him last.   I started him on Xarelto.  He did have cardioversion but now is back in fibrillation.  He feels well.  He denies any cardiovascular symptoms.  He has had no new shortness of breath, PND or orthopnea.  Has had no palpitations that he can notice and has had no presyncope or syncope.   Past Medical History:  Diagnosis Date  . HTN (hypertension)   . Hyperlipemia   . Inguinal hernia    left    Past Surgical History:  Procedure Laterality Date  . BACK SURGERY    . CARDIOVERSION N/A 11/26/2019   Procedure: CARDIOVERSION;  Surgeon: Pricilla Riffle, MD;  Location: [facility];  Service: Cardiovascular;  Laterality: N/A;  . HERNIA REPAIR     RIHR as child  . INGUINAL HERNIA REPAIR Left 07/16/2016   Procedure: OPEN LEFT INGUINAL HERNIA REPAIR WITH MESH;  Surgeon: Luretha Murphy, MD;  Location: [facility];  Service: General;  Laterality: Left;  . INSERTION OF MESH Left 07/16/2016   Procedure: INSERTION OF MESH;  Surgeon: Luretha Murphy, MD;  Location: [facility];  Service: General;  Laterality: Left;     Current Outpatient Medications  Medication Sig Dispense Refill  . atorvastatin (LIPITOR) 20 MG tablet Take 20 mg by mouth daily.     . butalbital-aspirin-caffeine (FIORINAL) 50-325-40 MG capsule Take 1 capsule by mouth 2 (two) times daily as needed for headache (back pain.).     Marland Kitchen Multiple Vitamin (MULTIVITAMIN WITH MINERALS) TABS tablet Take 1 tablet by mouth daily. Enhanced Energy Multivitamin    . olmesartan (BENICAR) 40 MG tablet Take 1 tablet (40 mg total) by mouth every evening. 30 tablet 3  . QUNOL COQ10/UBIQUINOL/MEGA PO Take 100 mg by mouth daily.    . rivaroxaban (XARELTO) 20 MG TABS tablet Take 1 tablet (20 mg total) by mouth daily with supper. 30 tablet 11  . Specialty Vitamins Products (PROSTATE PO) Take 2 tablets by mouth as needed. [employer] Prosta-Strong Red      No current facility-administered medications for this visit.    Allergies:   Codeine    ROS:  Please see the history of present illness.   Otherwise, review of systems are positive for none.   All other systems are reviewed and negative.    PHYSICAL EXAM: VS:  BP (!) 142/102   Pulse 84  , BMI There is no height or weight on file to calculate BMI. GENERAL:  Well appearing NECK:  No jugular venous distention, waveform within normal limits, carotid upstroke brisk and symmetric, no bruits, no thyromegaly LUNGS:  Clear to auscultation bilaterally CHEST:  Unremarkable HEART:  PMI not displaced or sustained,S1 and S2 within normal limits, no S3, no S4, no clicks, no rubs, 2 out of 6 brief diastolic murmur, 2 out of 6 soft early peaking systolic murmurs ABD:  Flat, positive bowel sounds normal in frequency in pitch, no bruits, no rebound, no guarding, no midline pulsatile mass, no hepatomegaly, no splenomegaly EXT:  2 plus pulses throughout, no edema, no cyanosis no clubbing   EKG:  EKG is  ordered today. The ekg ordered today demonstrates atrial fibrillation, rate 84, premature ventricular contractions with bigeminy, left axis deviation, poor anterior R wave progression, no acute ST-T wave changes.   Recent Labs: 04/27/2020: BUN 12;  Creatinine, Ser 0.62; Hemoglobin 14.1; Platelets 160; Potassium 4.0; Sodium 137    Lipid Panel No results found for: CHOL, TRIG, HDL, CHOLHDL, VLDL, LDLCALC, LDLDIRECT    Wt Readings from Last 3 Encounters:  03/16/20 150 lb (68 kg)  02/24/20 150 lb (68 kg)  01/15/20 150 lb (68 kg)      Other studies Reviewed: Additional studies/ records that were reviewed today include: TTE Review of the above records demonstrates:  Please see elsewhere in the note.     ASSESSMENT AND PLAN:  AORTIC INSUFFICIENCY:  This was moderate to severe.  He is asymptomatic.  (Class B to C1).   The EF is 50%.   Because it is an eccentric jet and the EF is low normal I am planning a TEE.     ATRIAL FIB:    He has recurrent atrial fibrillation.  He has absolutely no symptoms related to this.  He has good rate control.  He is tolerating anticoagulation.  I plan to pursue rate control and anticoagulation.   ELEVATED CORONARY CALCIUM:   He had a negative adequate POET (Plain Old Exercise Treadmill) last year.  He has no symptoms.  He will continue with risk reduction.  HTN: His blood pressure is not controlled and I am going to increase his olmesartan to 40 mg daily.  RISK REDUCTION:  His MESA score is 15.4.   DYSLIPIDEMIA: LDL is elevated 89 but this is much better than before.  He is having this managed by Rodrigo Ran, MD   COVID EDUCATION: He has been vaccinated.  Current medicines are reviewed at length with the patient today.  The patient does not have concerns regarding medicines.  The following changes have been made:  As above  Labs/ tests ordered today include:   Orders Placed This Encounter  Procedures  . CBC with Differential/Platelet  . Basic metabolic panel  . EKG 12-Lead     Disposition:   FU with in six months.   Signed, Rollene Rotunda, MD  04/28/2020 9:56 AM    [facility]

## 2020-04-27 ENCOUNTER — Encounter: Payer: Self-pay | Admitting: Cardiology

## 2020-04-27 ENCOUNTER — Ambulatory Visit (INDEPENDENT_AMBULATORY_CARE_PROVIDER_SITE_OTHER): Payer: Medicare Other | Admitting: Cardiology

## 2020-04-27 ENCOUNTER — Other Ambulatory Visit: Payer: Self-pay

## 2020-04-27 VITALS — BP 142/102 | HR 84

## 2020-04-27 DIAGNOSIS — Z01812 Encounter for preprocedural laboratory examination: Secondary | ICD-10-CM

## 2020-04-27 DIAGNOSIS — I4891 Unspecified atrial fibrillation: Secondary | ICD-10-CM | POA: Diagnosis not present

## 2020-04-27 DIAGNOSIS — R931 Abnormal findings on diagnostic imaging of heart and coronary circulation: Secondary | ICD-10-CM | POA: Diagnosis not present

## 2020-04-27 DIAGNOSIS — I10 Essential (primary) hypertension: Secondary | ICD-10-CM

## 2020-04-27 DIAGNOSIS — I351 Nonrheumatic aortic (valve) insufficiency: Secondary | ICD-10-CM | POA: Diagnosis not present

## 2020-04-27 MED ORDER — OLMESARTAN MEDOXOMIL 40 MG PO TABS: 40.0000 mg | ORAL_TABLET | Freq: Every evening | ORAL | 3 refills | Status: DC

## 2020-04-27 NOTE — Patient Instructions (Addendum)
Medication Instructions:  PLEASE INCREASE OLMESARTAN TO 40MG  DAILY  *If you need a refill on your cardiac medications before your next appointment, please call your pharmacy*  Lab Work: BMET and CBC today for pre-procedure labs If you have labs (blood work) drawn today and your tests are completely normal, you will receive your results only by: MyChart Message (if you have MyChart) OR . A paper copy in the mail If you have any lab test that is abnormal or we need to change your treatment, we will call you to review the results.  Testing/Procedures: Your physician has requested that you have a TEE. During a TEE, sound waves are used to create images of your heart. It provides your doctor with information about the size and shape of your heart and how well your heart's chambers and valves are working. In this test, a transducer is attached to the end of a flexible tube that's guided down your throat and into your esophagus (the tube leading from you mouth to your stomach) to get a more detailed image of your heart. You are not awake for the procedure. Please see the instruction sheet given to you today. For further information please visit Marland Kitchen.  Dear, [email or web address] You are scheduled for a TEE on 9/15 with Dr. 10/15 .  Please arrive at the [facility] (Main Entrance A) at [facility]: [address] [place], [place] at 1030 am.  DIET: Nothing to eat or drink after midnight except a sip of water with medications (see medication instructions below)  Medication Instructions:  Continue your anticoagulant: Xarelto You will need to continue your anticoagulant after your procedure until you  are told by your  Provider that it is safe to stop  You must have a responsible person to drive you home and stay in the waiting area during your procedure. Failure to do so could result in cancellation.  Bring your insurance cards.  *Special Note: Every effort is made  to have your procedure done on time. Occasionally there are emergencies that occur at the hospital that may cause delays. Please be patient if a delay does occur.   Follow-Up: At [facility], you and your health needs are our priority.  As part of our continuing mission to provide you with exceptional heart care, we have created designated Provider Care Teams.  These Care Teams include your primary Cardiologist (physician) and Advanced Practice Providers (APPs -  Physician Assistants and Nurse Practitioners) who all work together to provide you with the care you need, when you need it.  Your next appointment:   6 month(s)  The format for your next appointment:   In Person  Provider:   [facility], MD  Other Instructions YOU ARE SCHEDULED FOR COVID TEST Friday 9/10 at 2:40 PM. PLEASE ARRIVE 10 MINS EARLY FOR THIS. THIS WILL BE DONE AT [address] [place]   PLEASE LOOK INTO OMRON BLOOD PRESSURE CUFF

## 2020-04-28 ENCOUNTER — Telehealth: Payer: Self-pay | Admitting: Cardiology

## 2020-04-28 ENCOUNTER — Encounter: Payer: Self-pay | Admitting: Cardiology

## 2020-04-28 ENCOUNTER — Other Ambulatory Visit (HOSPITAL_COMMUNITY): Payer: Medicare Other

## 2020-04-28 LAB — BASIC METABOLIC PANEL
BUN/Creatinine Ratio: 19 (ref 10–24)
BUN: 12 mg/dL (ref 8–27)
CO2: 26 mmol/L (ref 20–29)
Calcium: 9.2 mg/dL (ref 8.6–10.2)
Chloride: 98 mmol/L (ref 96–106)
Creatinine, Ser: 0.62 mg/dL — ABNORMAL LOW (ref 0.76–1.27)
GFR calc Af Amer: 117 mL/min/{1.73_m2} (ref >59)
GFR calc non Af Amer: 101 mL/min/{1.73_m2} (ref >59)
Glucose: 101 mg/dL — ABNORMAL HIGH (ref 65–99)
Potassium: 4 mmol/L (ref 3.5–5.2)
Sodium: 137 mmol/L (ref 134–144)

## 2020-04-28 LAB — CBC WITH DIFFERENTIAL/PLATELET
Basophils Absolute: 0 10*3/uL (ref 0.0–0.2)
Basos: 1 %
EOS (ABSOLUTE): 0 10*3/uL (ref 0.0–0.4)
Eos: 1 %
Hematocrit: 42.2 % (ref 37.5–51.0)
Hemoglobin: 14.1 g/dL (ref 13.0–17.7)
Immature Grans (Abs): 0 10*3/uL (ref 0.0–0.1)
Immature Granulocytes: 0 %
Lymphocytes Absolute: 1.3 10*3/uL (ref 0.7–3.1)
Lymphs: 33 %
MCH: 31.3 pg (ref 26.6–33.0)
MCHC: 33.4 g/dL (ref 31.5–35.7)
MCV: 94 fL (ref 79–97)
Monocytes Absolute: 0.4 10*3/uL (ref 0.1–0.9)
Monocytes: 11 %
Neutrophils Absolute: 2 10*3/uL (ref 1.4–7.0)
Neutrophils: 54 %
Platelets: 160 10*3/uL (ref 150–450)
RBC: 4.51 x10E6/uL (ref 4.14–5.80)
RDW: 12.2 % (ref 11.6–15.4)
WBC: 3.8 10*3/uL (ref 3.4–10.8)

## 2020-04-28 NOTE — Telephone Encounter (Signed)
Reviewed labs with pt, pt verbalized understanding with no other questions at this time.

## 2020-04-28 NOTE — Telephone Encounter (Signed)
New message:      Patient calling to results. Please call patient back.

## 2020-04-29 ENCOUNTER — Other Ambulatory Visit (HOSPITAL_COMMUNITY)
Admission: RE | Admit: 2020-04-29 | Discharge: 2020-04-29 | Disposition: A | Discharge status: Home / Self Care | Payer: Medicare Other | Source: Ambulatory Visit | Attending: Internal Medicine | Admitting: Internal Medicine

## 2020-04-29 DIAGNOSIS — Z20822 Contact with and (suspected) exposure to covid-19: Secondary | ICD-10-CM | POA: Insufficient documentation

## 2020-04-29 DIAGNOSIS — Z01818 Encounter for other preprocedural examination: Secondary | ICD-10-CM | POA: Insufficient documentation

## 2020-04-29 LAB — SARS CORONAVIRUS 2 (TAT 6-24 HRS): SARS Coronavirus 2: NEGATIVE

## 2020-05-01 ENCOUNTER — Other Ambulatory Visit (HOSPITAL_COMMUNITY): Payer: Medicare Other

## 2020-05-02 ENCOUNTER — Other Ambulatory Visit: Payer: Self-pay

## 2020-05-02 DIAGNOSIS — I351 Nonrheumatic aortic (valve) insufficiency: Secondary | ICD-10-CM

## 2020-05-02 NOTE — Progress Notes (Signed)
Orders placed for TEE 9/15

## 2020-05-03 ENCOUNTER — Ambulatory Visit (HOSPITAL_COMMUNITY)
Admission: RE | Admit: 2020-05-03 | Discharge: 2020-05-03 | Disposition: A | Discharge status: Home / Self Care | Payer: Medicare Other | Attending: Internal Medicine | Admitting: Internal Medicine

## 2020-05-03 ENCOUNTER — Encounter (HOSPITAL_COMMUNITY)
Admission: RE | Disposition: A | Discharge status: Home / Self Care | Payer: Medicare Other | Source: Home / Self Care | Attending: Internal Medicine

## 2020-05-03 ENCOUNTER — Encounter (HOSPITAL_COMMUNITY): Payer: Self-pay | Admitting: Internal Medicine

## 2020-05-03 ENCOUNTER — Other Ambulatory Visit: Payer: Self-pay

## 2020-05-03 ENCOUNTER — Ambulatory Visit (HOSPITAL_COMMUNITY): Payer: Medicare Other | Admitting: Certified Registered Nurse Anesthetist

## 2020-05-03 ENCOUNTER — Ambulatory Visit (HOSPITAL_BASED_OUTPATIENT_CLINIC_OR_DEPARTMENT_OTHER): Payer: Medicare Other

## 2020-05-03 DIAGNOSIS — E785 Hyperlipidemia, unspecified: Secondary | ICD-10-CM | POA: Insufficient documentation

## 2020-05-03 DIAGNOSIS — I083 Combined rheumatic disorders of mitral, aortic and tricuspid valves: Secondary | ICD-10-CM | POA: Diagnosis present

## 2020-05-03 DIAGNOSIS — I4891 Unspecified atrial fibrillation: Secondary | ICD-10-CM | POA: Insufficient documentation

## 2020-05-03 DIAGNOSIS — I10 Essential (primary) hypertension: Secondary | ICD-10-CM | POA: Diagnosis not present

## 2020-05-03 DIAGNOSIS — Z7901 Long term (current) use of anticoagulants: Secondary | ICD-10-CM | POA: Diagnosis not present

## 2020-05-03 DIAGNOSIS — I351 Nonrheumatic aortic (valve) insufficiency: Secondary | ICD-10-CM | POA: Diagnosis not present

## 2020-05-03 DIAGNOSIS — Z885 Allergy status to narcotic agent status: Secondary | ICD-10-CM | POA: Insufficient documentation

## 2020-05-03 DIAGNOSIS — I340 Nonrheumatic mitral (valve) insufficiency: Secondary | ICD-10-CM | POA: Diagnosis not present

## 2020-05-03 DIAGNOSIS — Z79899 Other long term (current) drug therapy: Secondary | ICD-10-CM | POA: Diagnosis not present

## 2020-05-03 HISTORY — PX: BUBBLE STUDY: SHX6837

## 2020-05-03 HISTORY — PX: TEE WITHOUT CARDIOVERSION: SHX5443

## 2020-05-03 LAB — ECHO TEE
AR max vel: 4.11 cm2
AV Area VTI: 3.66 cm2
AV Area mean vel: 3.6 cm2
AV Mean grad: 4 mmHg
AV Peak grad: 6.7 mmHg
Ao pk vel: 1.29 m/s
P 1/2 time: 172 msec

## 2020-05-03 SURGERY — ECHOCARDIOGRAM, TRANSESOPHAGEAL
Anesthesia: General

## 2020-05-03 MED ORDER — PROPOFOL 10 MG/ML IV BOLUS
INTRAVENOUS | Status: DC | PRN
  Administered 2020-05-03 (×2): 30 mg via INTRAVENOUS
  Administered 2020-05-03 (×2): 20 mg via INTRAVENOUS
  Administered 2020-05-03 (×2): 30 mg via INTRAVENOUS

## 2020-05-03 MED ORDER — PHENYLEPHRINE HCL (PRESSORS) 10 MG/ML IV SOLN
INTRAVENOUS | Status: DC | PRN
  Administered 2020-05-03: 80 ug via INTRAVENOUS

## 2020-05-03 MED ORDER — PROPOFOL 500 MG/50ML IV EMUL
INTRAVENOUS | Status: DC | PRN
  Administered 2020-05-03: 75 ug/kg/min via INTRAVENOUS

## 2020-05-03 MED ORDER — GLYCOPYRROLATE 0.2 MG/ML IJ SOLN
INTRAMUSCULAR | Status: DC | PRN
  Administered 2020-05-03: .2 mg via INTRAVENOUS

## 2020-05-03 MED ORDER — LIDOCAINE HCL (CARDIAC) PF 100 MG/5ML IV SOSY
PREFILLED_SYRINGE | INTRAVENOUS | Status: DC | PRN
  Administered 2020-05-03: 100 mg via INTRAVENOUS

## 2020-05-03 MED ORDER — SODIUM CHLORIDE 0.9 % IV SOLN: INTRAVENOUS | Status: DC

## 2020-05-03 MED ORDER — LACTATED RINGERS IV SOLN: INTRAVENOUS | Status: DC | PRN

## 2020-05-03 MED ORDER — BUTAMBEN-TETRACAINE-BENZOCAINE 2-2-14 % EX AERO
INHALATION_SPRAY | CUTANEOUS | Status: DC | PRN
  Administered 2020-05-03: 2 via TOPICAL

## 2020-05-03 NOTE — Anesthesia Preprocedure Evaluation (Signed)
Anesthesia Evaluation  Patient identified by MRN, date of birth, ID band Patient awake    Reviewed: Allergy & Precautions, NPO status , Patient's Chart, lab work & pertinent test results  Airway Mallampati: II  TM Distance: >3 FB Neck ROM: Full    Dental no notable dental hx. (+) Teeth Intact   Pulmonary neg pulmonary ROS,    Pulmonary exam normal breath sounds clear to auscultation       Cardiovascular hypertension, Pt. on medications + CAD  + Valvular Problems/Murmurs AI  Rhythm:Irregular Rate:Normal  Severe AI  Echo 04/13/20 1. Left ventricular ejection fraction, by estimation, is 50 to 55%. The left ventricle has low normal function. The left ventricle has no regional wall motion abnormalities. There is severe left ventricular hypertrophy of the basal-septal segment. Left ventricular diastolic parameters are indeterminate.  2. Right ventricular systolic function is mildly reduced. The right ventricular size is normal. There is normal pulmonary artery systolic pressure. The estimated right ventricular systolic pressure is 29.0 mmHg.  3. Left atrial size was severely dilated.  4. The mitral valve is normal in structure. Mild to moderate mitral valve regurgitation. No evidence of mitral stenosis.  5. The aortic valve is tricuspid. Aortic valve regurgitation is moderate. Mild aortic valve sclerosis is present, with no evidence of aortic valve  stenosis. Aortic regurgitation PHT measures 337 msec.  6. Aortic dilatation noted. There is mild dilatation of the aortic root and of the ascending aorta measuring 41 mm and 19mm respectively.  7. The inferior vena cava is normal in size with greater than 50% respiratory variability, suggesting right atrial pressure of 3 mmHg  EKG 04/27/20 Atrial fibrillation, LAD   Neuro/Psych negative neurological ROS  negative psych ROS   GI/Hepatic negative GI ROS, Neg liver ROS,   Endo/Other   Hyperlipidemia  Renal/GU negative Renal ROS  negative genitourinary   Musculoskeletal negative musculoskeletal ROS (+)   Abdominal   Peds  Hematology Rivaroxaban- last dose yesterday   Anesthesia Other Findings   Reproductive/Obstetrics                             Anesthesia Physical  Anesthesia Plan  ASA: III  Anesthesia Plan: General   Post-op Pain Management:    Induction: Intravenous  PONV Risk Score and Plan: 2 and Ondansetron and Treatment may vary due to age or medical condition  Airway Management Planned: Mask  Additional Equipment:   Intra-op Plan:   Post-operative Plan:   Informed Consent: I have reviewed the patients History and Physical, chart, labs and discussed the procedure including the risks, benefits and alternatives for the proposed anesthesia with the patient or authorized representative who has indicated his/her understanding and acceptance.     Dental advisory given  Plan Discussed with: CRNA and Surgeon  Anesthesia Plan Comments:         Anesthesia Quick Evaluation

## 2020-05-03 NOTE — Discharge Instructions (Signed)

## 2020-05-03 NOTE — Interval H&P Note (Signed)
History and Physical Interval Note:  05/03/2020 11:46 AM  Carlos Bailey  has presented today for surgery, with the diagnosis of SEVER AORTIC INSUFFICIENCY.  The various methods of treatment have been discussed with the patient and family. After consideration of risks, benefits and other options for treatment, the patient has consented to  Procedure(s): TRANSESOPHAGEAL ECHOCARDIOGRAM (TEE) (N/A) as a surgical intervention.  The patient's history has been reviewed, patient examined, no change in status, stable for surgery.  I have reviewed the patient's chart and labs.  Questions were answered to the patient's satisfaction.     [name]

## 2020-05-03 NOTE — Anesthesia Postprocedure Evaluation (Signed)
Anesthesia Post Note  Patient: Carlos Bailey  Procedure(s) Performed: TRANSESOPHAGEAL ECHOCARDIOGRAM (TEE) (N/A ) BUBBLE STUDY     Patient location during evaluation: PACU Anesthesia Type: General Level of consciousness: awake and alert and oriented Pain management: pain level controlled Vital Signs Assessment: post-procedure vital signs reviewed and stable Respiratory status: spontaneous breathing, nonlabored ventilation and respiratory function stable Cardiovascular status: blood pressure returned to baseline and stable Postop Assessment: no apparent nausea or vomiting Anesthetic complications: no   No complications documented.  Last Vitals:  Vitals:   05/03/20 1335 05/03/20 1340  BP: 136/84   Pulse: 100 79  Resp: 15 13  Temp:    SpO2: 99% 97%    Last Pain:  Vitals:   05/03/20 1335  TempSrc:   PainSc: 0-No pain                 Kambria Grima A.

## 2020-05-03 NOTE — Anesthesia Procedure Notes (Signed)
Procedure Name: MAC Date/Time: 05/03/2020 12:00 PM Performed by: Nazire Fruth T, CRNA Pre-anesthesia Checklist: Patient identified, Emergency Drugs available, Suction available, Patient being monitored and Timeout performed Patient Re-evaluated:Patient Re-evaluated prior to induction Oxygen Delivery Method: Nasal cannula Preoxygenation: Pre-oxygenation with 100% oxygen Induction Type: IV induction Placement Confirmation: positive ETCO2

## 2020-05-03 NOTE — Progress Notes (Signed)
Echocardiogram Echocardiogram Transesophageal has been performed. (by Thurman Coyer)  Warren Lacy Serena Petterson 05/03/2020, 1:14 PM

## 2020-05-03 NOTE — CV Procedure (Signed)
INDICATIONS: Aortic valve regurgitation  PROCEDURE:   Informed consent was obtained prior to the procedure. The risks, benefits and alternatives for the procedure were discussed and the patient comprehended these risks.  Risks include, but are not limited to, cough, sore throat, vomiting, nausea, somnolence, esophageal and stomach trauma or perforation, bleeding, low blood pressure, aspiration, pneumonia, infection, trauma to the teeth and death.    After a procedural time-out, the oropharynx was anesthetized with 20% benzocaine spray.   During this procedure the patient was administered propofol per anesthesia.  The patient's heart rate, blood pressure, and oxygen saturation were monitored continuously during the procedure. The period of conscious sedation was 60 minutes, of which I was present face-to-face 100% of this time.  The transesophageal probe was inserted in the esophagus and stomach without difficulty and multiple views were obtained.  The patient was kept under observation until the patient left the procedure room.  The patient left the procedure room in stable condition.   Agitated microbubble saline contrast was administered.  COMPLICATIONS:    There were no immediate complications.  FINDINGS:   FORMAL ECHOCARDIOGRAM REPORT PENDING Quantitation of AI pending - see formal report. At least moderate-severe AI. Very eccentric jet.  Dilated Sinus of Valsalva 46 mm.  Mild-moderate MR Mild-moderate TR  RECOMMENDATIONS:    Avoid heavy lifting. Maintain normotension.   Patient was better rate controlled in Afib when leaving Endo suite, without medication intervention.  Time Spent Directly with the Patient:  75 minutes   [name] 05/03/2020, 1:29 PM

## 2020-05-03 NOTE — Transfer of Care (Signed)
Immediate Anesthesia Transfer of Care Note  Patient: Carlos Bailey  Procedure(s) Performed: TRANSESOPHAGEAL ECHOCARDIOGRAM (TEE) (N/A ) BUBBLE STUDY  Patient Location: PACU and Endoscopy Unit  Anesthesia Type:General  Level of Consciousness: drowsy  Airway & Oxygen Therapy: Patient Spontanous Breathing and Patient connected to nasal cannula oxygen  Post-op Assessment: Report given to RN, Post -op Vital signs reviewed and stable and Patient moving all extremities  Post vital signs: Reviewed and stable  Last Vitals:  Vitals Value Taken Time  BP 119/66 05/03/20 1312  Temp 36.6 C 05/03/20 1312  Pulse 113 05/03/20 1314  Resp 17 05/03/20 1314  SpO2 99 % 05/03/20 1314  Vitals shown include unvalidated device data.  Last Pain:  Vitals:   05/03/20 1312  TempSrc: Oral  PainSc: 0-No pain         Complications: No complications documented.

## 2020-05-04 ENCOUNTER — Other Ambulatory Visit (HOSPITAL_COMMUNITY): Payer: Self-pay | Admitting: Internal Medicine

## 2020-05-04 ENCOUNTER — Encounter (HOSPITAL_COMMUNITY): Payer: Self-pay | Admitting: Internal Medicine

## 2020-05-04 ENCOUNTER — Telehealth: Payer: Self-pay

## 2020-05-04 DIAGNOSIS — I517 Cardiomegaly: Secondary | ICD-10-CM

## 2020-05-04 DIAGNOSIS — I10 Essential (primary) hypertension: Secondary | ICD-10-CM

## 2020-05-04 DIAGNOSIS — R011 Cardiac murmur, unspecified: Secondary | ICD-10-CM

## 2020-05-04 NOTE — Telephone Encounter (Signed)
Spoke to patient about email sent in today.I spoke to Dr.Hochrein he advised he will be calling you to go over TEE results.He advised no urgency.Patient reassured.

## 2020-05-08 ENCOUNTER — Telehealth: Payer: Self-pay | Admitting: Cardiology

## 2020-05-08 DIAGNOSIS — I7121 Aneurysm of the ascending aorta, without rupture: Secondary | ICD-10-CM | POA: Insufficient documentation

## 2020-05-08 NOTE — Telephone Encounter (Signed)
Addressed via MyChart message

## 2020-05-08 NOTE — Telephone Encounter (Signed)
Selena from Cardi[facility] in [facility] to inform she sent a fax about the patient needing a CT.

## 2020-05-08 NOTE — Telephone Encounter (Signed)
Rollene Rotunda, MD  You 4 minutes ago (1:59 PM)   I called the patient.  We agree that he will wait to see Dr. Laneta Simmers and will defer any further imaging until after that consultation.

## 2020-05-11 ENCOUNTER — Institutional Professional Consult (permissible substitution) (INDEPENDENT_AMBULATORY_CARE_PROVIDER_SITE_OTHER): Payer: Medicare Other | Admitting: Surgery

## 2020-05-11 ENCOUNTER — Other Ambulatory Visit: Payer: Self-pay

## 2020-05-11 ENCOUNTER — Other Ambulatory Visit: Payer: Self-pay | Admitting: *Deleted

## 2020-05-11 VITALS — BP 160/90 | HR 88 | Temp 97.8°F | Resp 20 | Ht 68.0 in | Wt 145.0 lb

## 2020-05-11 DIAGNOSIS — I712 Thoracic aortic aneurysm, without rupture, unspecified: Secondary | ICD-10-CM

## 2020-05-11 DIAGNOSIS — I7121 Aneurysm of the ascending aorta, without rupture: Secondary | ICD-10-CM

## 2020-05-11 DIAGNOSIS — R931 Abnormal findings on diagnostic imaging of heart and coronary circulation: Secondary | ICD-10-CM

## 2020-05-11 DIAGNOSIS — I351 Nonrheumatic aortic (valve) insufficiency: Secondary | ICD-10-CM

## 2020-05-12 ENCOUNTER — Encounter: Payer: Self-pay | Admitting: Surgery

## 2020-05-12 NOTE — Progress Notes (Signed)
Cardiothoracic Surgery Consulatation  PCP is Rodrigo Ran, MD Referring Provider is Self, Irving Burton, [place]  Chief Complaint  Patient presents with  . Thoracic Aortic Aneurysm    Surgical consult, ECHO 05/04/20, NO chest CTA done  . Aortic Insuffiency    HPI:  The patient is a 69 year old gentleman with a history of hypertension, hyperlipidemia, and atrial fibrillation on Xarelto who was referred to Dr. Antoine Poche by his primary physician Dr. Waynard Edwards for evaluation of aortic insufficiency.  He had a 2D echocardiogram in 06/2016 that showed mild aortic insufficiency.  The AI pressure half-time was measured at 329 ms.  His aortic root was measured at 46 mm with an ascending aorta of 36 mm.  He had a follow-up echocardiogram in January 2020 which showed moderate aortic insufficiency with a pressure half-time of 661 ms.  A repeat echocardiogram in February 2021 was read as moderate to severe aortic insufficiency with a pressure half-time of 279 ms.  The aortic root diameter was measured at 4 cm with an ascending aorta of 4.1 cm.  His most recent 2D echocardiogram on 04/13/2020 again showed moderate to severe aortic insufficiency with an AI pressure half-time of 337 ms.  Left ventricular ejection fraction was 55% with no regional wall motion abnormalities.  The LV diastolic dimension was 4.6 cm with a systolic dimension of 3.55 cm.  He was scheduled for a TEE on 05/03/2020 which showed moderate to severe aortic insufficiency with a trileaflet aortic valve.  The jet originated centrally and was then very eccentric directed toward the base of the anterior mitral leaflet.  The vena contractor was 0.5 cm with a regurgitant volume of 38.5 cc.  The pressure half-time was variable due to rapid atrial fibrillation and was estimated at 275 ms.  There was no holodiastolic flow reversal seen.  The diameter at the sinus of Valsalva level is 4.6 cm with an ascending aorta of 3.5 cm.  Left ventricular ejection fraction was  estimated at 60 to 65% with normal LV function.  There was evidence of a PFO with bidirectional flow.  There was mild to moderate MR and TR.  The patient is here today with his wife.  He denies any symptoms of chest pain, shortness of breath, fatigue, dizziness or syncope, or peripheral edema.  He remains very active doing aerobic exercise daily. Past Medical History:  Diagnosis Date  . HTN (hypertension)   . Hyperlipemia   . Inguinal hernia    left    Past Surgical History:  Procedure Laterality Date  . BACK SURGERY    . BUBBLE STUDY  05/03/2020   Procedure: BUBBLE STUDY;  Surgeon: [name], MD;  Location: [facility] ENDOSCOPY;  Service: Cardiology;;  . CARDIOVERSION N/A 11/26/2019   Procedure: CARDIOVERSION;  Surgeon: Pricilla Riffle, MD;  Location: [facility] ENDOSCOPY;  Service: Cardiovascular;  Laterality: N/A;  . HERNIA REPAIR     RIHR as child  . INGUINAL HERNIA REPAIR Left 07/16/2016   Procedure: OPEN LEFT INGUINAL HERNIA REPAIR WITH MESH;  Surgeon: Luretha Murphy, MD;  Location: [facility];  Service: General;  Laterality: Left;  . INSERTION OF MESH Left 07/16/2016   Procedure: INSERTION OF MESH;  Surgeon: Luretha Murphy, MD;  Location: [facility];  Service: General;  Laterality: Left;  . TEE WITHOUT CARDIOVERSION N/A 05/03/2020   Procedure: TRANSESOPHAGEAL ECHOCARDIOGRAM (TEE);  Surgeon: [name], MD;  Location: [facility] END[facility]COPY;  Service: Cardiology;  Laterality: N/A;    Family History  Problem  Relation Age of Onset  . Aneurysm Mother   . Diabetes Mellitus II Mother   . Stroke Mother   . Obesity Sister   . Diabetes Mellitus II Sister     Social History Social History   Tobacco Use  . Smoking status: Never Smoker  . Smokeless tobacco: Never Used  Substance Use Topics  . Alcohol use: Yes    Comment: social  . Drug use: No    Current Outpatient Medications  Medication Sig Dispense Refill  . atorvastatin (LIPITOR) 20 MG tablet Take 20  mg by mouth daily.     . butalbital-aspirin-caffeine (FIORINAL) 50-325-40 MG capsule Take 1 capsule by mouth 2 (two) times daily as needed for headache (back pain.).     Marland Kitchen Multiple Vitamin (MULTIVITAMIN WITH MINERALS) TABS tablet Take 1 tablet by mouth daily. Enhanced Energy Multivitamin    . olmesartan (BENICAR) 40 MG tablet Take 1 tablet (40 mg total) by mouth every evening. 30 tablet 3  . QUNOL COQ10/UBIQUINOL/MEGA PO Take 100 mg by mouth daily.    . rivaroxaban (XARELTO) 20 MG TABS tablet Take 1 tablet (20 mg total) by mouth daily with supper. 30 tablet 11   No current facility-administered medications for this visit.    Allergies  Allergen Reactions  . Codeine Nausea Only    Review of Systems  Constitutional: Negative for activity change, chills, fatigue and fever.  HENT: Negative.        Saw his dentist in 6/21  Eyes: Negative.   Respiratory: Negative for chest tightness and shortness of breath.   Cardiovascular: Negative for chest pain, palpitations and leg swelling.  Gastrointestinal: Negative.   Endocrine: Negative.   Genitourinary: Negative.   Musculoskeletal:       Swelling in left ankle from fracture.  Skin: Negative.   Neurological: Negative for dizziness and syncope.  Hematological: Bruises/bleeds easily.       On Xarelto  Psychiatric/Behavioral: Negative.     BP (!) 160/90   Pulse 88   Temp 97.8 F (36.6 C) (Skin)   Resp 20   Ht  (1.727 m)   Wt 145 lb (65.8 kg)   SpO2 97% Comment: RA  BMI 22.05 kg/m  Physical Exam Constitutional:      Appearance: Normal appearance. He is normal weight.  HENT:     Head: Normocephalic and atraumatic.  Eyes:     Extraocular Movements: Extraocular movements intact.     Conjunctiva/sclera: Conjunctivae normal.     Pupils: Pupils are equal, round, and reactive to light.  Neck:     Vascular: No carotid bruit.  Cardiovascular:     Rate and Rhythm: Normal rate and regular rhythm.     Pulses: Normal pulses.      Heart sounds: Murmur heard.      Comments: 2/6 diastolic murmur along the right lower sternal border Pulmonary:     Effort: Pulmonary effort is normal.     Breath sounds: Normal breath sounds.  Musculoskeletal:        General: No swelling.     Cervical back: Normal range of motion and neck supple.  Skin:    General: Skin is warm and dry.  Neurological:     General: No focal deficit present.     Mental Status: He is alert and oriented to person, place, and time.  Psychiatric:        Mood and Affect: Mood normal.        Behavior: Behavior normal.  Diagnostic Tests:  ECHOCARDIOGRAM REPORT       [facility]ILLIAM [facility] Date of Exam: 04/13/2020  Medical Rec #: [id number]      Height:    68.0 in  Accession #:  [id number]     Weight:    150.0 lb  Date of Birth: [birth year]      BSA:     1.809 m  Patient Age:  69 years      BP:      150/93 mmHg  Patient Gender: M          HR:      76 bpm.  Exam Location: [address]   Procedure: 2D Echo, Cardiac Doppler and Color Doppler   Indications:  I35.9 Aortic valve disorder    History:    Patient has prior history of Echocardiogram examinations,  most         recent 10/12/2019. Arrythmias:Atrial Fibrillation; Risk         Factors:Hypertension and Dyslipidemia. Elevated coronary  calcium         score.    Sonographer:  Cathie BeamsAngela Gregory RCS  Referring Phys: [id number]   IMPRESSIONS    1. Left ventricular ejection fraction, by estimation, is 50 to 55%. The  left ventricle has low normal function. The left ventricle has no regional  wall motion abnormalities. There is severe left ventricular hypertrophy of  the basal-septal segment. Left  ventricular diastolic parameters are indeterminate.  2. Right ventricular systolic function is mildly reduced. The right  ventricular size is normal. There is normal pulmonary  artery systolic  pressure. The estimated right ventricular systolic pressure is 29.0 mmHg.  3. Left atrial size was severely dilated.  4. The mitral valve is normal in structure. Mild to moderate mitral valve  regurgitation. No evidence of mitral stenosis.  5. The aortic valve is tricuspid. Aortic valve regurgitation is moderate.  Mild aortic valve sclerosis is present, with no evidence of aortic valve  stenosis. Aortic regurgitation PHT measures 337 msec.  6. Aortic dilatation noted. There is mild dilatation of the aortic root  and of the ascending aorta measuring 41 mm and 38mm respectively.  7. The inferior vena cava is normal in size with greater than 50%  respiratory variability, suggesting right atrial pressure of 3 mmHg.   FINDINGS  Left Ventricle: Left ventricular ejection fraction, by estimation, is 50  to 55%. The left ventricle has low normal function. The left ventricle has  no regional wall motion abnormalities. The left ventricular internal  cavity size was normal in size.  There is severe left ventricular hypertrophy of the basal-septal segment.  Left ventricular diastolic parameters are indeterminate.   Right Ventricle: The right ventricular size is normal. No increase in  right ventricular wall thickness. Right ventricular systolic function is  mildly reduced. There is normal pulmonary artery systolic pressure. The  tricuspid regurgitant velocity is 2.55  m/s, and with an assumed right atrial pressure of 3 mmHg, the estimated  right ventricular systolic pressure is 29.0 mmHg.   Left Atrium: Left atrial size was severely dilated.   Right Atrium: Right atrial size was normal in size.   Pericardium: There is no evidence of pericardial effusion.   Mitral Valve: The mitral valve is normal in structure. There is mild  calcification of the mitral valve leaflet(s). Normal mobility of the  mitral valve leaflets. Mild to moderate mitral valve regurgitation. No    evidence of mitral valve stenosis.   Tricuspid  Valve: The tricuspid valve is normal in structure. Tricuspid  valve regurgitation is mild . No evidence of tricuspid stenosis.   Aortic Valve: The aortic valve is tricuspid. Aortic valve regurgitation is  moderate. Aortic regurgitation PHT measures 337 msec. Mild aortic valve  sclerosis is present, with no evidence of aortic valve stenosis.   Pulmonic Valve: The pulmonic valve was normal in structure. Pulmonic valve  regurgitation is trivial. No evidence of pulmonic stenosis.   Aorta: Aortic dilatation noted. There is mild dilatation of the aortic  root and of the ascending aorta measuring 41 mm.   Venous: The inferior vena cava is normal in size with greater than 50%  respiratory variability, suggesting right atrial pressure of 3 mmHg.   IAS/Shunts: No atrial level shunt detected by color flow Doppler.     LEFT VENTRICLE  PLAX 2D  LVIDd:     4.60 cm  LVIDs:     3.55 cm  LV PW:     1.20 cm  LV IVS:    2.20 cm  LVOT diam:   2.27 cm  LV SV:     63  LV SV Index:  35  LVOT Area:   4.04 cm     RIGHT VENTRICLE  RV Basal diam: 3.10 cm  RV S prime:   12.17 cm/s  TAPSE (M-mode): 1.5 cm  RVSP:      29.0 mmHg   LEFT ATRIUM       Index    RIGHT ATRIUM      Index  LA diam:    4.70 cm 2.60 cm/m RA Pressure: 3.00 mmHg  LA Vol (A2C):  94.4 ml 52.20 ml/m RA Area:   16.20 cm  LA Vol (A4C):  112.0 ml 61.93 ml/m RA Volume:  35.90 ml 19.85 ml/m  LA Biplane Vol: 111.0 ml 61.38 ml/m  AORTIC VALVE  LVOT Vmax:  90.47 cm/s  LVOT Vmean: 53.933 cm/s  LVOT VTI:  0.156 m  AI PHT:   337 msec    AORTA  Ao Root diam: 4.10 cm   MR Peak grad:  89.9 mmHg  TRICUSPID VALVE  MR Mean grad:  59.3 mmHg  TR Peak grad:  26.0 mmHg  MR Vmax:     474.00 cm/s TR Vmax:    255.00 cm/s  MR Vmean:    357.3 cm/s Estimated RAP: 3.00 mmHg  MR PISA:     2.26 cm   RVSP:      29.0 mmHg  MR PISA Eff ROA: 18 mm  MR PISA Radius: 0.60 cm   SHUNTS                Systemic VTI: 0.16 m                Systemic Diam: 2.27 cm   Armanda Magic MD  Electronically signed by Armanda Magic MD  Signature Date/Time: 04/13/2020/9:15:13 AM     TRANSESOPHOGEAL ECHO REPORT       Patient Name:  Carlos Bailey Date of Exam: 05/03/2020  Medical Rec #: [id number]      Height:    68.0 in  Accession #:  [id number]     Weight:    145.0 lb  Date of Birth: 1951/04/27      BSA:     1.783 m  Patient Age:  69 years      BP:      119/66 mmHg  Patient Gender: M          HR:  130 bpm.  Exam Location: Inpatient   Procedure: Transesophageal Echo, Color Doppler, Cardiac Doppler and Saline       Contrast Bubble Study   Indications:   Aortic Valve Disorder i35.9    History:     Patient has prior history of Echocardiogram examinations,  most          recent 04/13/2020. Risk Factors:Hypertension and  Dyslipidemia.    Sonographer:   Thurman Coyer RDCS (AE)  Referring Phys: [id number] [facility]  Diagnosing Phys: Weston Brass MD   PROCEDURE: After discussion of the risks and benefits of a TEE, an  informed consent was obtained from the patient. TEE procedure time was 50  minutes. The transesophogeal probe was passed without difficulty through  the esophogus of the patient. Imaged  were obtained with the patient in a left lateral decubitus position. Local  oropharyngeal anesthetic was provided with Cetacaine. Sedation performed  by different physician. The patient was monitored while under deep  sedation. Anesthestetic sedation was  provided intravenously by Anesthesiology: 562mg  of Propofol, 100mg  of  Lidocaine. Image quality was good. The patient's vital signs; including  heart rate, blood pressure, and oxygen saturation; remained stable    throughout the procedure. The patient  developed no complications during the procedure.   IMPRESSIONS    1. Moderate-severe aortic valve regurgitation. Jet originates centrally,  and is then very eccentric, directed toward the base of the anterior  mitral leaflet. Mechanism likely annular dilation. Aortic valve  regurgitant vena contracta 0.5 cm. Regurgitant  volume by stroke volume method is 38.5 mL. PHT variable due to rapid  atrial fibrillation, approximately 275 ms. No holodiastolic flow reversals  seen. These findings in combination with color Doppler assessment suggest  moderate-severe aortic valve  regurgitation. The aortic valve is tricuspid. No aortic stenosis is  present.  2. Aortic dilatation noted. There is moderate dilatation at the level of  the sinuses of Valsalva, measuring 46 mm.  3. Left ventricular ejection fraction, by estimation, is 60 to 65%. The  left ventricle has normal function.  4. Right ventricular systolic function is normal. The right ventricular  size is normal.  5. Left atrial size was severely dilated. No left atrial/left atrial  appendage thrombus was detected.  6. The mitral valve is grossly normal. Mild to moderate mitral valve  regurgitation.  7. Tricuspid valve regurgitation is mild to moderate.  8. Evidence of atrial level shunting detected by color flow Doppler.  There is a small patent foramen ovale with bidirectional shunting across  atrial septum.   FINDINGS  Left Ventricle: Left ventricular ejection fraction, by estimation, is 60  to 65%. The left ventricle has normal function. The left ventricular  internal cavity size was normal in size.   Right Ventricle: The right ventricular size is normal. No increase in  right ventricular wall thickness. Right ventricular systolic function is  normal.   Left Atrium: Left atrial size was severely dilated. No left atrial/left  atrial appendage thrombus was detected.   Right Atrium:  Right atrial size was normal in size.   Pericardium: There is no evidence of pericardial effusion.   Mitral Valve: The mitral valve is grossly normal. Mild to moderate mitral  valve regurgitation. MV peak gradient, 2.6 mmHg. The mean mitral valve  gradient is 1.0 mmHg.   Tricuspid Valve: The tricuspid valve is grossly normal. Tricuspid valve  regurgitation is mild to moderate.   Aortic Valve: Moderate-severe aortic valve regurgitation. Jet originates  centrally, and is then  very eccentric, directed toward the base of the  anterior mitral leaflet. Mechanism likely annular dilation. Aortic valve  regurgitant vena contracta 0.5 cm.  Regurgitant volume by stroke volume method is 38.5 mL. PHT variable due to  rapid atrial fibrillation, approximately 275 ms. No holodiastolic flow  reversals seen. These findings in combination with color Doppler  assessment suggest moderate-severe aortic  valve regurgitation. The aortic valve is tricuspid. Aortic regurgitation  PHT measures 172 msec. No aortic stenosis is present. Aortic valve mean  gradient measures 4.0 mmHg. Aortic valve peak gradient measures 6.7 mmHg.  Aortic valve area, by VTI measures  3.66 cm.   Pulmonic Valve: The pulmonic valve was normal in structure. Pulmonic valve  regurgitation is trivial.   Aorta: Aortic dilatation noted. There is moderate dilatation at the level  of the sinuses of Valsalva, measuring 46 mm.   IAS/Shunts: Evidence of atrial level shunting detected by color flow  Doppler. Agitated saline contrast was given intravenously to evaluate for  intracardiac shunting. A small patent foramen ovale is detected with  bidirectional shunting across atrial  septum.     LEFT VENTRICLE  PLAX 2D  LVOT diam:   2.50 cm  LV SV:     78  LV SV Index:  44  LVOT Area:   4.91 cm     RIGHT VENTRICLE  RVOT diam:   2.30 cm   AORTIC VALVE  AV Area (Vmax):  4.11 cm  AV Area (Vmean):  3.60 cm  AV Area  (VTI):   3.66 cm  AV Vmax:      129.00 cm/s  AV Vmean:     88.600 cm/s  AV VTI:      0.213 m  AV Peak Grad:   6.7 mmHg  AV Mean Grad:   4.0 mmHg  LVOT Vmax:     108.00 cm/s  LVOT Vmean:    64.900 cm/s  LVOT VTI:     0.159 m  LVOT/AV VTI ratio: 0.75  AI PHT:      172 msec   MITRAL VALVE        TRICUSPID VALVE  MV Peak grad: 2.6 mmHg   TR Peak grad:  23.8 mmHg  MV Mean grad: 1.0 mmHg   TR Vmax:    244.00 cm/s  MV Vmax:   0.81 m/s  MV Vmean:   53.7 cm/s  SHUNTS  MV E velocity: 81.50 cm/s Systemic VTI: 0.16 m               Systemic Diam: 2.50 cm               Pulmonic Diam: 2.30 cm   Weston Brass MD  Electronically signed by Weston Brass MD  Signature Date/Time: 05/03/2020/11:21:45 PM     Impression:  This 69 year old gentleman has a trileaflet aortic valve with moderate to severe aortic insufficiency and no stenosis as well as mild aortic root dilatation with a diameter of 4.6 cm.  I have personally reviewed his 2D echocardiogram and TEE.  The aortic valve is trileaflet with mild thickening and no significant calcification.  The regurgitation appears to start centrally and then becomes eccentric which may mean failure of coaptation at the center of the valve possibly with some leaflet prolapse.  His left ventricular ejection fraction is 60 to 65% with normal LV internal dimensions and he remains completely asymptomatic with good exercise capacity.  I do not think there is any indication for aortic valve repair or replacement at this  time.  This will require continued careful follow-up with echocardiograms at 6 month intervals to follow-up on his LV function and internal dimensions.  I also discussed the importance of being in tune to his body and any symptoms of exertional fatigue or shortness of breath that develop.  His aortic root dimension is 4.6 cm which is mildly enlarged but there is  normal diameter of the sinotubular junction and proximal ascending aorta at about 3.5 cm.  He has a trileaflet aortic valve, no family history of aortic aneurysm or dissection and no history of connective tissue disorder.  I do not think there is any indication for replacement of his aortic root.  The generally accepted dimension for surgery would be around 5.5 cm.  His yearly mortality with an aorta this size is less than 0.2% and his operative mortality for replacement of his aortic root and repair or replacement of his aortic valve is probably in the 3 to 5% range.  I think he should have a CTA of the chest, abdomen, and pelvis to assess the remainder of his aorta.  I reviewed the echocardiogram images with him and his wife and answered all the questions.  Plan:  I will order a CTA of the chest, abdomen, and pelvis to assess his entire aorta and we will see him back after that to review the images with him and answer any further questions.  I spent 50 minutes performing this consultation and > 50% of this time was spent face to face counseling and coordinating the care of this patient's aortic aneurysm and moderate to severe aortic insufficiency.   Carlos Borne, MD [facility] [phone]

## 2020-05-24 ENCOUNTER — Other Ambulatory Visit: Payer: Self-pay

## 2020-05-24 ENCOUNTER — Ambulatory Visit
Admission: RE | Admit: 2020-05-24 | Discharge: 2020-05-24 | Disposition: A | Discharge status: Home / Self Care | Payer: Medicare Other | Source: Ambulatory Visit | Attending: Surgery | Admitting: Surgery

## 2020-05-24 ENCOUNTER — Ambulatory Visit (INDEPENDENT_AMBULATORY_CARE_PROVIDER_SITE_OTHER): Payer: Medicare Other | Admitting: Surgery

## 2020-05-24 ENCOUNTER — Encounter: Payer: Self-pay | Admitting: Surgery

## 2020-05-24 VITALS — BP 170/81 | HR 82 | Temp 97.7°F | Resp 18 | Ht 68.0 in | Wt 149.8 lb

## 2020-05-24 DIAGNOSIS — I712 Thoracic aortic aneurysm, without rupture, unspecified: Secondary | ICD-10-CM

## 2020-05-24 DIAGNOSIS — R931 Abnormal findings on diagnostic imaging of heart and coronary circulation: Secondary | ICD-10-CM | POA: Diagnosis not present

## 2020-05-24 MED ORDER — IOPAMIDOL (ISOVUE-370) INJECTION 76%
75.0000 mL | Freq: Once | INTRAVENOUS | Status: AC | PRN
  Administered 2020-05-24: 75 mL via INTRAVENOUS

## 2020-05-24 NOTE — Progress Notes (Signed)
HPI:  Mr. Carlos Bailey returns today to review the results of his CTA of the chest, abdomen, and pelvis to completely evaluate for aortic aneurysm.  2D echocardiogram showed a 4.6 cm aortic root at the sinus level and a trileaflet aortic valve with moderate to severe aortic insufficiency.  He has normal left ventricular systolic function and normal internal dimensions of the left ventricle.  He is physically very active and asymptomatic.  Current Outpatient Medications  Medication Sig Dispense Refill  . atorvastatin (LIPITOR) 20 MG tablet Take 20 mg by mouth daily.     . butalbital-aspirin-caffeine (FIORINAL) 50-325-40 MG capsule Take 1 capsule by mouth 2 (two) times daily as needed for headache (back pain.).     Marland Kitchen Multiple Vitamin (MULTIVITAMIN WITH MINERALS) TABS tablet Take 1 tablet by mouth daily. Enhanced Energy Multivitamin    . olmesartan (BENICAR) 40 MG tablet Take 1 tablet (40 mg total) by mouth every evening. 30 tablet 3  . QUNOL COQ10/UBIQUINOL/MEGA PO Take 100 mg by mouth daily.    . rivaroxaban (XARELTO) 20 MG TABS tablet Take 1 tablet (20 mg total) by mouth daily with supper. 30 tablet 11   No current facility-administered medications for this visit.     Physical Exam: BP (!) 170/81 (BP Location: Right Arm, Patient Position: Sitting)   Pulse 82   Temp 97.7 F (36.5 C)   Resp 18   Ht 5\' 8"  (1.727 m)   Wt 149 lb 12.8 oz (67.9 kg)   SpO2 96% Comment: RA  BMI 22.78 kg/m    Diagnostic Tests:  CLINICAL DATA:  Ascending aortic aneurysm.  EXAM: CT ANGIOGRAPHY CHEST, ABDOMEN AND PELVIS  TECHNIQUE: Non-contrast CT of the chest was initially obtained.  Multidetector CT imaging through the chest, abdomen and pelvis was performed using the standard protocol during bolus administration of intravenous contrast. Multiplanar reconstructed images and MIPs were obtained and reviewed to evaluate the vascular anatomy.  CONTRAST:  57mL ISOVUE-370 IOPAMIDOL (ISOVUE-370)  INJECTION 76%  COMPARISON:  None.  FINDINGS: CTA CHEST FINDINGS  Cardiovascular: 4 cm ascending thoracic aortic aneurysm is noted. No dissection is noted. Great vessels are widely patent. Normal cardiac size. No pericardial effusion. Transverse aortic arch measures 2.9 cm. Proximal descending thoracic aorta measures 2.9 cm.  Mediastinum/Nodes: No enlarged mediastinal, hilar, or axillary lymph nodes. Thyroid gland, trachea, and esophagus demonstrate no significant findings.  Lungs/Pleura: Lungs are clear. No pleural effusion or pneumothorax.  Musculoskeletal: No chest wall abnormality. No acute or significant osseous findings.  Review of the MIP images confirms the above findings.  CTA ABDOMEN AND PELVIS FINDINGS  VASCULAR  Aorta: Atherosclerosis of abdominal aorta is noted without aneurysm or dissection.  Celiac: Patent without evidence of dissection, vasculitis or significant stenosis. However, mild fusiform aneurysmal dilatation of the distal portion of the main trunk is noted measuring 11 mm.  SMA: Patent without evidence of aneurysm, dissection, vasculitis or significant stenosis.  Renals: Single right renal artery is noted, with 2 left renal arteries being present. No significant stenosis or dissection is seen involving either side.  IMA: Patent without evidence of aneurysm, dissection, vasculitis or significant stenosis.  Inflow: Patent without evidence of aneurysm, dissection, vasculitis or significant stenosis.  Veins: No obvious venous abnormality within the limitations of this arterial phase study.  Review of the MIP images confirms the above findings.  NON-VASCULAR  Hepatobiliary: No focal liver abnormality is seen. No gallstones, gallbladder wall thickening, or biliary dilatation.  Pancreas: Unremarkable. No pancreatic ductal dilatation  or surrounding inflammatory changes.  Spleen: Normal in size without focal  abnormality.  Adrenals/Urinary Tract: Adrenal glands are unremarkable. Kidneys are normal, without renal calculi, focal lesion, or hydronephrosis. Bladder is unremarkable.  Stomach/Bowel: Stomach is within normal limits. Appendix appears normal. No evidence of bowel wall thickening, distention, or inflammatory changes.  Lymphatic: No significant adenopathy is noted.  Reproductive: Mild prostatic enlargement is noted.  Other: No abdominal wall hernia or abnormality. No abdominopelvic ascites.  Musculoskeletal: No acute or significant osseous findings.  Review of the MIP images confirms the above findings.  IMPRESSION: 1. 4 cm ascending thoracic aortic aneurysm is noted. No dissection is noted. Recommend annual imaging followup by CTA or MRA. This recommendation follows 2010 ACCF/AHA/AATS/ACR/ASA/SCA/SCAI/SIR/STS/SVM Guidelines for the Diagnosis and Management of Patients with Thoracic Aortic Disease. Circulation. 2010; 121: Z610-R604. Aortic aneurysm NOS (ICD10-I71.9). 2. Atherosclerosis of abdominal aorta is noted without aneurysm or dissection. 3. 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 Attention to this finding on follow-up imaging is recommended. 4. Mild prostatic enlargement.  Aortic Atherosclerosis (ICD10-I70.0).   Electronically Signed   By: Lupita Raider M.D.   On: 05/24/2020 14:16   Impression:  This 69 year old gentleman has a 4.6 cm aortic root at the sinus level with a trileaflet aortic valve and moderate to severe aortic insufficiency.  I have personally reviewed the CTA of the chest, abdomen and pelvis and he has mild dilatation of the ascending aorta with a maximum diameter of 3.9 cm.  The aortic arch and descending thoracic aorta measured 2.9 cm.  This would be considered mild aneurysmal enlargement of the ascending aorta and certainly well below the surgical threshold of  5.5 cm.  The aortic root and proximal ascending aorta can be followed on echocardiogram but should be done every 6 months to follow-up on his aortic insufficiency and left ventricular systolic function and dimensions.  I think what will drive the need for surgery in the future will be his aortic insufficiency and when he develops any symptoms of exertional fatigue and shortness of breath or signs of left ventricular deterioration or dilatation.  He has mild dilatation of the celiac artery which radiology measured at 1.1 cm.  I reviewed the CTA findings with the patient and his wife and answered their questions.  I think it would be reasonable to repeat his CTA of the chest, abdomen and pelvis in 1 year to be sure that things are stable and if there has been no change at that time a 2D echocardiogram should be adequate for following his aortic root and proximal ascending aorta.  I discussed the importance of good blood pressure control and preventing further enlargement and acute aortic dissection.  His blood pressure is elevated today but he received contrast for his CTA which is likely the cause of that.  He will continue monitoring his blood pressure at home.  He is on Benicar for hypertension and may benefit from addition of a beta-blocker to decrease aortic wall shear stress.  I would leave that decision up to cardiology.   Plan:  I will plan to see him back in 1 year with a CTA of the chest, abdomen, and pelvis.  He will continue to follow-up with Dr. Antoine Poche for his aortic insufficiency and Dr. Antoine Poche will schedule his 2D echocardiograms.  I spent 15 minutes performing this established patient evaluation and > 50% of this time was spent face to face counseling and  coordinating the care of this patient's aortic aneurysm.    Gaye Pollack, MD [facility] [phone]

## 2020-06-13 ENCOUNTER — Ambulatory Visit: Payer: Medicare Other | Admitting: Cardiology

## 2020-06-13 MED ORDER — METOPROLOL SUCCINATE ER 50 MG PO TB24: 50.0000 mg | ORAL_TABLET | Freq: Every day | ORAL | 3 refills | Status: DC

## 2020-06-13 NOTE — Telephone Encounter (Signed)
Spoke with patient. Patient will see Dr. Antoine Poche on 08/22/19. Medication list updated to show patient reported amlodipine. Patient will not take toprol xl at this time. Will forward to Dr. Antoine Poche as an Lorain Childes.

## 2020-06-13 NOTE — Telephone Encounter (Signed)
Per Dr. Antoine Poche: "Please start Toprol XL 50 mg daily and he can have his appt rescheduled for about 3 months."

## 2020-07-19 ENCOUNTER — Encounter (INDEPENDENT_AMBULATORY_CARE_PROVIDER_SITE_OTHER): Payer: Self-pay | Admitting: Otolaryngology

## 2020-07-19 ENCOUNTER — Other Ambulatory Visit: Payer: Self-pay

## 2020-07-19 ENCOUNTER — Ambulatory Visit (INDEPENDENT_AMBULATORY_CARE_PROVIDER_SITE_OTHER): Payer: Medicare Other | Admitting: Otolaryngology

## 2020-07-19 VITALS — Temp 97.3°F

## 2020-07-19 DIAGNOSIS — H6123 Impacted cerumen, bilateral: Secondary | ICD-10-CM

## 2020-07-19 DIAGNOSIS — R931 Abnormal findings on diagnostic imaging of heart and coronary circulation: Secondary | ICD-10-CM

## 2020-07-19 NOTE — Progress Notes (Signed)
HPI: Carlos Bailey is a 69 y.o. male who presents for evaluation of which wax buildup in his ears.  He was last cleaned in 2017.  More recently after going swimming he noticed blockage of both ears.  He has had no pain or drainage from the ear..  Past Medical History:  Diagnosis Date  . HTN (hypertension)   . Hyperlipemia   . Inguinal hernia    left   Past Surgical History:  Procedure Laterality Date  . BACK SURGERY    . BUBBLE STUDY  05/03/2020   Procedure: BUBBLE STUDY;  Surgeon: [name], MD;  Location: [facility] ENDOSCOPY;  Service: Cardiology;;  . CARDIOVERSION N/A 11/26/2019   Procedure: CARDIOVERSION;  Surgeon: Pricilla Riffle, MD;  Location: [facility] ENDOSCOPY;  Service: Cardiovascular;  Laterality: N/A;  . HERNIA REPAIR     RIHR as child  . INGUINAL HERNIA REPAIR Left 07/16/2016   Procedure: OPEN LEFT INGUINAL HERNIA REPAIR WITH MESH;  Surgeon: Luretha Murphy, MD;  Location: [facility];  Service: General;  Laterality: Left;  . INSERTION OF MESH Left 07/16/2016   Procedure: INSERTION OF MESH;  Surgeon: Luretha Murphy, MD;  Location: [facility];  Service: General;  Laterality: Left;  . TEE WITHOUT CARDIOVERSION N/A 05/03/2020   Procedure: TRANSESOPHAGEAL ECHOCARDIOGRAM (TEE);  Surgeon: [name], MD;  Location: [facility] ENDOSCOPY;  Service: Cardiology;  Laterality: N/A;   Social History   Socioeconomic History  . Marital status: Married    Spouse name: Not on file  . Number of children: Not on file  . Years of education: Not on file  . Highest education level: Not on file  Occupational History  . Not on file  Tobacco Use  . Smoking status: Never Smoker  . Smokeless tobacco: Never Used  Substance and Sexual Activity  . Alcohol use: Yes    Comment: social  . Drug use: No  . Sexual activity: Not on file  Other Topics Concern  . Not on file  Social History Narrative   Married.  Lives in [place].     Social Determinants of Health    Financial Resource Strain:   . Difficulty of Paying Living Expenses: Not on file  Food Insecurity:   . Worried About Programme researcher, broadcasting/film/video in the Last Year: Not on file  . Ran Out of Food in the Last Year: Not on file  Transportation Needs:   . Lack of Transportation (Medical): Not on file  . Lack of Transportation (Non-Medical): Not on file  Physical Activity:   . Days of Exercise per Week: Not on file  . Minutes of Exercise per Session: Not on file  Stress:   . Feeling of Stress : Not on file  Social Connections:   . Frequency of Communication with Friends and Family: Not on file  . Frequency of Social Gatherings with Friends and Family: Not on file  . Attends Religious Services: Not on file  . Active Member of Clubs or Organizations: Not on file  . Attends Banker Meetings: Not on file  . Marital Status: Not on file   Family History  Problem Relation Age of Onset  . Aneurysm Mother   . Diabetes Mellitus II Mother   . Stroke Mother   . Obesity Sister   . Diabetes Mellitus II Sister    Allergies  Allergen Reactions  . Codeine Nausea Only   Prior to Admission medications   Medication Sig Start Date End  Date Taking? Authorizing Provider  amLODipine (NORVASC) 5 MG tablet Take 5 mg by mouth daily. 06/05/20  Yes [provider]  atorvastatin (LIPITOR) 20 MG tablet Take 20 mg by mouth daily.  11/16/19  Yes [provider]  butalbital-aspirin-caffeine Benny Lennert) 50-325-40 MG capsule Take 1 capsule by mouth 2 (two) times daily as needed for headache (back pain.).    Yes [provider]  Multiple Vitamin (MULTIVITAMIN WITH MINERALS) TABS tablet Take 1 tablet by mouth daily. Enhanced Energy Multivitamin   Yes [provider]  olmesartan (BENICAR) 40 MG tablet Take 1 tablet (40 mg total) by mouth every evening. 04/27/20  Yes Rollene Rotunda, MD  Theodoro Grist COQ10/UBIQUINOL/MEGA PO Take 100 mg by mouth daily.   Yes [provider]   rivaroxaban (XARELTO) 20 MG TABS tablet Take 1 tablet (20 mg total) by mouth daily with supper. 10/15/19  Yes Rollene Rotunda, MD     Positive ROS: Otherwise negative  All other systems have been reviewed and were otherwise negative with the exception of those mentioned in the HPI and as above.  Physical Exam: Constitutional: Alert, well-appearing, no acute distress Ears: External ears without lesions or tenderness. Ear canals with a large amount of wax buildup in both ear canals.  This was cleaned with suction.  TMs were otherwise clear.. Nasal: External nose without lesions. Clear nasal passages Oral: Oropharynx clear. Neck: No palpable adenopathy or masses Respiratory: Breathing comfortably  Skin: No facial/neck lesions or rash noted.  Cerumen impaction removal  Date/Time: 07/19/2020 11:45 AM Performed by: Drema Halon, MD Authorized by: Drema Halon, MD   Consent:    Consent obtained:  Verbal   Consent given by:  Patient   Risks discussed:  Pain and bleeding Procedure details:    Location:  L ear and R ear   Procedure type: curette and suction   Post-procedure details:    Inspection:  TM intact and canal normal   Hearing quality:  Improved   Patient tolerance of procedure:  Tolerated well, no immediate complications Comments:     TMs are clear bilaterally.    Assessment: Bilateral cerumen impactions.  Plan: This was cleaned in the office. He will follow-up as needed.  Narda Bonds, MD

## 2020-07-20 ENCOUNTER — Other Ambulatory Visit: Payer: Self-pay | Admitting: Cardiology

## 2020-08-19 DIAGNOSIS — I712 Thoracic aortic aneurysm, without rupture: Secondary | ICD-10-CM | POA: Insufficient documentation

## 2020-08-19 DIAGNOSIS — I7121 Aneurysm of the ascending aorta, without rupture: Secondary | ICD-10-CM | POA: Insufficient documentation

## 2020-08-19 NOTE — Progress Notes (Deleted)
Cardiology Office Note   Date:  08/19/2020   ID:  Carlos Bailey, DOB Mar 25, 1951, MRN [id number]  PCP:  Crist Infante, MD  Cardiologist:   No primary care provider on file. Referring:  Crist Infante, MD  No chief complaint on file.     History of Present Illness: Carlos Bailey is a 70 y.o. male who is referred by Crist Infante, MD for evaluation of AI.   This was moderate to severe.  He has been seen by Dr. Cyndia Bent and Dr. Ysidro Evert at [facility] and is being followed without indication for valve replacement at this point.   He also has atrial fib and an ascending aortic dilatation.  ***  ** on echo two days ago.  The EF appeared to be about 50%.  I reviewed this result with Dr. Margaretann Loveless and Dr. Roxy Manns.  The plan was to check a TEE as this is an eccentric jet and we can further quantify with TEE.  I called to discuss this result with him yesterday.   He was in atrial fib when I saw him last.   I started him on Xarelto.  He did have cardioversion but now is back in fibrillation.  He feels well.  He denies any cardiovascular symptoms.  He has had no new shortness of breath, PND or orthopnea.  Has had no palpitations that he can notice and has had no presyncope or syncope.   Past Medical History:  Diagnosis Date  . HTN (hypertension)   . Hyperlipemia   . Inguinal hernia    left    Past Surgical History:  Procedure Laterality Date  . BACK SURGERY    . BUBBLE STUDY  05/03/2020   Procedure: BUBBLE STUDY;  Surgeon: Elouise Munroe, MD;  Location: [place];  Service: Cardiology;;  . CARDIOVERSION N/A 11/26/2019   Procedure: CARDIOVERSION;  Surgeon: Fay Records, MD;  Location: [place];  Service: Cardiovascular;  Laterality: N/A;  . [place] as child  . INGUINAL HERNIA REPAIR Left 07/16/2016   Procedure: OPEN LEFT INGUINAL HERNIA REPAIR WITH MESH;  Surgeon: Johnathan Hausen, MD;  Location: [place];  Service: General;  Laterality: Left;  .  INSERTION OF MESH Left 07/16/2016   Procedure: INSERTION OF MESH;  Surgeon: Johnathan Hausen, MD;  Location: [place];  Service: General;  Laterality: Left;  . TEE WITHOUT CARDIOVERSION N/A 05/03/2020   Procedure: TRANSESOPHAGEAL ECHOCARDIOGRAM (TEE);  Surgeon: Elouise Munroe, MD;  Location: [place];  Service: Cardiology;  Laterality: N/A;     Current Outpatient Medications  Medication Sig Dispense Refill  . amLODipine (NORVASC) 5 MG tablet Take 5 mg by mouth daily.    Marland Kitchen atorvastatin (LIPITOR) 20 MG tablet Take 20 mg by mouth daily.     . butalbital-aspirin-caffeine (FIORINAL) 50-325-40 MG capsule Take 1 capsule by mouth 2 (two) times daily as needed for headache (back pain.).     Marland Kitchen Multiple Vitamin (MULTIVITAMIN WITH MINERALS) TABS tablet Take 1 tablet by mouth daily. Enhanced Energy Multivitamin    . olmesartan (BENICAR) 40 MG tablet TAKE 1 TABLET BY MOUTH EVERY DAY IN THE EVENING 90 tablet 1  . QUNOL COQ10/UBIQUINOL/MEGA PO Take 100 mg by mouth daily.    . rivaroxaban (XARELTO) 20 MG TABS tablet Take 1 tablet (20 mg total) by mouth daily with supper. 30 tablet 11   No current facility-administered medications for this visit.    Allergies:   Codeine  ROS:  Please see the history of present illness.   Otherwise, review of systems are positive for ***.   All other systems are reviewed and negative.    PHYSICAL EXAM: VS:  There were no vitals taken for this visit. , BMI There is no height or weight on file to calculate BMI. GENERAL:  Well appearing NECK:  No jugular venous distention, waveform within normal limits, carotid upstroke brisk and symmetric, no bruits, no thyromegaly LUNGS:  Clear to auscultation bilaterally CHEST:  Unremarkable HEART:  PMI not displaced or sustained,S1 and S2 within normal limits, no S3, no S4, no clicks, no rubs, *** murmurs ABD:  Flat, positive bowel sounds normal in frequency in pitch, no bruits, no rebound, no guarding, no  midline pulsatile mass, no hepatomegaly, no splenomegaly EXT:  2 plus pulses throughout, no edema, no cyanosis no clubbing     ***GENERAL:  Well appearing NECK:  No jugular venous distention, waveform within normal limits, carotid upstroke brisk and symmetric, no bruits, no thyromegaly LUNGS:  Clear to auscultation bilaterally CHEST:  Unremarkable HEART:  PMI not displaced or sustained,S1 and S2 within normal limits, no S3, no S4, no clicks, no rubs, 2 out of 6 brief diastolic murmur, 2 out of 6 soft early peaking systolic murmurs ABD:  Flat, positive bowel sounds normal in frequency in pitch, no bruits, no rebound, no guarding, no midline pulsatile mass, no hepatomegaly, no splenomegaly EXT:  2 plus pulses throughout, no edema, no cyanosis no clubbing   EKG:  EKG is *** ordered today. The ekg ordered today demonstrates atrial fibrillation, rate *** premature ventricular contractions with bigeminy, left axis deviation, poor anterior R wave progression, no acute ST-T wave changes.   Recent Labs: 04/27/2020: BUN 12; Creatinine, Ser 0.62; Hemoglobin 14.1; Platelets 160; Potassium 4.0; Sodium 137    Lipid Panel No results found for: CHOL, TRIG, HDL, CHOLHDL, VLDL, LDLCALC, LDLDIRECT    Wt Readings from Last 3 Encounters:  05/24/20 149 lb 12.8 oz (67.9 kg)  05/11/20 145 lb (65.8 kg)  05/03/20 145 lb (65.8 kg)      Other studies Reviewed: Additional studies/ records that were reviewed today include:   Surgical consultation note. *** Review of the above records demonstrates:  Please see elsewhere in the note.     ASSESSMENT AND PLAN:  AORTIC INSUFFICIENCY:  This was moderate to severe.  He is asymptomatic.  (Class B to C1).   The EF is 50%.   He has had two surgical opinions with Dr. Laneta Simmers and Dr. Joanna Hews at [facility] and he will continue to be followed without need for surgery at this point.  ***  Because it is an eccentric jet and the EF is low normal I am planning a TEE.     ATRIAL  FIB:    Carlos Bailey has a CHA2DS2 - VASc score of 2.  *** He has recurrent atrial fibrillation.  He has absolutely no symptoms related to this.  He has good rate control.  He is tolerating anticoagulation.  I plan to pursue rate control and anticoagulation.   ELEVATED CORONARY CALCIUM:   He had a negative adequate POET (Plain Old Exercise Treadmill) in 2020. ***   He has no symptoms.  He will continue with risk reduction.  ASCENDING ANEURYSM:  4.7 cm.  This will be followed along with his aortic valve.  ***   HTN: His blood pressure is *** not controlled and I am going to increase his olmesartan to  40 mg daily.  RISK REDUCTION:  His MESA score is 15.4.  ***  DYSLIPIDEMIA: LDL is elevated *** 89 but this is much better than before.  He is having this managed by Rodrigo Ran, MD     Current medicines are reviewed at length with the patient today.  The patient does not have concerns regarding medicines.  The following changes have been made:  ***  Labs/ tests ordered today include: ***  No orders of the defined types were placed in this encounter.    Disposition:   FU with in ***months.   Signed, Rollene Rotunda, MD  08/19/2020 9:30 AM    [facility]

## 2020-08-21 ENCOUNTER — Ambulatory Visit: Payer: Medicare Other | Admitting: Cardiology

## 2020-08-21 DIAGNOSIS — I351 Nonrheumatic aortic (valve) insufficiency: Secondary | ICD-10-CM

## 2020-08-21 DIAGNOSIS — R931 Abnormal findings on diagnostic imaging of heart and coronary circulation: Secondary | ICD-10-CM

## 2020-08-21 DIAGNOSIS — I712 Thoracic aortic aneurysm, without rupture: Secondary | ICD-10-CM

## 2020-08-21 DIAGNOSIS — I10 Essential (primary) hypertension: Secondary | ICD-10-CM

## 2020-08-21 DIAGNOSIS — E785 Hyperlipidemia, unspecified: Secondary | ICD-10-CM

## 2020-09-14 DIAGNOSIS — I4891 Unspecified atrial fibrillation: Secondary | ICD-10-CM | POA: Insufficient documentation

## 2020-09-14 NOTE — Progress Notes (Signed)
Cardiology Office Note   Date:  09/15/2020   ID:  Carlos Bailey, DOB Apr 07, 1951, MRN [id number]  PCP:  Rodrigo Ran, MD  Cardiologist:   No primary care provider on file. Referring:  Rodrigo Ran, MD  No chief complaint on file.     History of Present Illness: Carlos Bailey is a 70 y.o. male who is referred by Rodrigo Ran, MD for evaluation of AI.   This was moderate to severe.  He has been seen by Dr. Laneta Simmers and Dr. Kizzie Bane at [facility] and is being followed without indication for valve replacement at this point.   He also has atrial fib and an ascending aortic dilatation.    Since I last saw him he has had no new complaints.  He is swimming 3 days a week and exercising other days in other ways.  The patient denies any new symptoms such as chest discomfort, neck or arm discomfort. There has been no new shortness of breath, PND or orthopnea. There have been no reported palpitations, presyncope or syncope.    Past Medical History:  Diagnosis Date  . HTN (hypertension)   . Hyperlipemia   . Inguinal hernia    left    Past Surgical History:  Procedure Laterality Date  . BACK SURGERY    . BUBBLE STUDY  05/03/2020   Procedure: BUBBLE STUDY;  Surgeon: [name], MD;  Location: [facility] ENDOSCOPY;  Service: Cardiology;;  . CARDIOVERSION N/A 11/26/2019   Procedure: CARDIOVERSION;  Surgeon: Pricilla Riffle, MD;  Location: [facility] ENDOSCOPY;  Service: Cardiovascular;  Laterality: N/A;  . HERNIA REPAIR     RIHR as child  . INGUINAL HERNIA REPAIR Left 07/16/2016   Procedure: OPEN LEFT INGUINAL HERNIA REPAIR WITH MESH;  Surgeon: Luretha Murphy, MD;  Location: [facility];  Service: General;  Laterality: Left;  . INSERTION OF MESH Left 07/16/2016   Procedure: INSERTION OF MESH;  Surgeon: Luretha Murphy, MD;  Location: [facility];  Service: General;  Laterality: Left;  . TEE WITHOUT CARDIOVERSION N/A 05/03/2020   Procedure: TRANSESOPHAGEAL ECHOCARDIOGRAM  (TEE);  Surgeon: [name], MD;  Location: [facility] ENDOSCOPY;  Service: Cardiology;  Laterality: N/A;     Current Outpatient Medications  Medication Sig Dispense Refill  . amLODipine (NORVASC) 5 MG tablet Take 5 mg by mouth daily.    Marland Kitchen amoxicillin (AMOXIL) 500 MG tablet Take 4 tablets (2,000 mg total) by mouth once for 1 dose. Take the morning before your dental procedures. 4 tablet 3  . atorvastatin (LIPITOR) 20 MG tablet Take 20 mg by mouth daily.     . butalbital-aspirin-caffeine (FIORINAL) 50-325-40 MG capsule Take 1 capsule by mouth 2 (two) times daily as needed for headache (back pain.).     Marland Kitchen Multiple Vitamin (MULTIVITAMIN WITH MINERALS) TABS tablet Take 1 tablet by mouth daily. Enhanced Energy Multivitamin    . olmesartan (BENICAR) 40 MG tablet TAKE 1 TABLET BY MOUTH EVERY DAY IN THE EVENING 90 tablet 1  . QUNOL COQ10/UBIQUINOL/MEGA PO Take 100 mg by mouth daily.    . rivaroxaban (XARELTO) 20 MG TABS tablet Take 1 tablet (20 mg total) by mouth daily with supper. 30 tablet 11   No current facility-administered medications for this visit.    Allergies:   Codeine    ROS:  Please see the history of present illness.   Otherwise, review of systems are positive for none.   All other systems are reviewed and negative.  PHYSICAL EXAM: VS:  BP (!) 148/92   Pulse 90   Ht 5\' 8"  (1.727 m)   Wt 156 lb 12.8 oz (71.1 kg)   BMI 23.84 kg/m  , BMI Body mass index is 23.84 kg/m. GENERAL:  Well appearing NECK:  No jugular venous distention, waveform within normal limits, carotid upstroke brisk and symmetric, no bruits, no thyromegaly LUNGS:  Clear to auscultation bilaterally CHEST:  Unremarkable HEART:  PMI not displaced or sustained,S1 and S2 within normal limits, no S3, no S4, no clicks, no rubs, 2 out of 6 brief 6 diastolic murmur, 2 out of 6 soft early peaking systolic murmurs ABD:  Flat, positive bowel sounds normal in frequency in pitch, no bruits, no rebound, no guarding, no  midline pulsatile mass, no hepatomegaly, no splenomegaly EXT:  2 plus pulses throughout, no edema, no cyanosis no clubbing   EKG:  EKG is  ordered today. The ekg ordered today demonstrates atrial fibrillation, rate 90 premature ventricular contractions with bigeminy, left axis deviation, poor anterior R wave progression, no acute ST-T wave changes.   Recent Labs: 04/27/2020: BUN 12; Creatinine, Ser 0.62; Hemoglobin 14.1; Platelets 160; Potassium 4.0; Sodium 137    Lipid Panel No results found for: CHOL, TRIG, HDL, CHOLHDL, VLDL, LDLCALC, LDLDIRECT    Wt Readings from Last 3 Encounters:  09/15/20 156 lb 12.8 oz (71.1 kg)  05/24/20 149 lb 12.8 oz (67.9 kg)  05/11/20 145 lb (65.8 kg)      Other studies Reviewed: Additional studies/ records that were reviewed today include:   None Review of the above records demonstrates:  NA   ASSESSMENT AND PLAN:  AORTIC INSUFFICIENCY:  This was moderate to severe.  He is asymptomatic.  (Class B to C1).   The EF is 50%.   He has had two surgical opinions with Dr. 05/13/20 and Dr. Laneta Simmers at [facility] and he will continue to be followed without need for surgery at this point.  He is going to see Dr. [facility] back in September I believe at [facility] and I will defer CT imaging to them and also discussed with him whether they want to do the echocardiogram that he will need around that time.  ATRIAL FIB:    Carlos Bailey has a CHA2DS2 - VASc score of 2.  Continue current therapy.    ELEVATED CORONARY CALCIUM:   He had a negative adequate POET (Plain Old Exercise Treadmill) in 2020.  He will continue with risk reduction.  ASCENDING ANEURYSM:  4.7 cm.  This will be followed along with his aortic valve.     HTN: His blood pressure is slightly elevated and he had amlodipine added.  He is going to keep a blood pressure diary and he might need to go up to 7.5 but he will give me the report of the readings.   RISK REDUCTION:  His MESA score is 15.4.     DYSLIPIDEMIA: LDL is elevated 89 but his HDL was excellent.  It was down from before.  That was last year.  We will check again this year.     Current medicines are reviewed at length with the patient today.  The patient does not have concerns regarding medicines.  The following changes have been made:  None  Labs/ tests ordered today include:   Orders Placed This Encounter  Procedures  . Lipid panel  . Hepatic function panel  . EKG 12-Lead     Disposition:   FU with in 75months.  Signed, Rollene Rotunda, MD  09/15/2020 9:34 AM    [facility]

## 2020-09-15 ENCOUNTER — Encounter: Payer: Self-pay | Admitting: Cardiology

## 2020-09-15 ENCOUNTER — Other Ambulatory Visit: Payer: Self-pay

## 2020-09-15 ENCOUNTER — Ambulatory Visit (INDEPENDENT_AMBULATORY_CARE_PROVIDER_SITE_OTHER): Payer: Medicare Other | Admitting: Cardiology

## 2020-09-15 VITALS — BP 148/92 | HR 90 | Ht 68.0 in | Wt 156.8 lb

## 2020-09-15 DIAGNOSIS — I351 Nonrheumatic aortic (valve) insufficiency: Secondary | ICD-10-CM

## 2020-09-15 DIAGNOSIS — I7121 Aneurysm of the ascending aorta, without rupture: Secondary | ICD-10-CM

## 2020-09-15 DIAGNOSIS — I10 Essential (primary) hypertension: Secondary | ICD-10-CM

## 2020-09-15 DIAGNOSIS — I712 Thoracic aortic aneurysm, without rupture: Secondary | ICD-10-CM | POA: Diagnosis not present

## 2020-09-15 DIAGNOSIS — I4891 Unspecified atrial fibrillation: Secondary | ICD-10-CM

## 2020-09-15 DIAGNOSIS — R931 Abnormal findings on diagnostic imaging of heart and coronary circulation: Secondary | ICD-10-CM

## 2020-09-15 DIAGNOSIS — E785 Hyperlipidemia, unspecified: Secondary | ICD-10-CM

## 2020-09-15 MED ORDER — AMOXICILLIN 500 MG PO TABS: 2000.0000 mg | ORAL_TABLET | Freq: Once | ORAL | 3 refills | Status: DC

## 2020-09-15 NOTE — Patient Instructions (Addendum)
Medication Instructions:  Amoxicillin 2gm (4 tablets) the morning before your dental procedures. *If you need a refill on your cardiac medications before your next appointment, please call your pharmacy*  Lab Work: Your physician recommends that you return for lab work: FASTING LIPIDS/LIVER If you have labs (blood work) drawn today and your tests are completely normal, you will receive your results only by: Marland Kitchen MyChart Message (if you have MyChart) OR . A paper copy in the mail If you have any lab test that is abnormal or we need to change your treatment, we will call you to review the results.  Testing/Procedures: None ordered this visit  Follow-Up: At [facility], you and your health needs are our priority.  As part of our continuing mission to provide you with exceptional heart care, we have created designated Provider Care Teams.  These Care Teams include your primary Cardiologist (physician) and Advanced Practice Providers (APPs -  Physician Assistants and Nurse Practitioners) who all work together to provide you with the care you need, when you need it.   Your next appointment:   6 month(s)  You will receive a reminder letter in the mail two months in advance. If you don't receive a letter, please call our office to schedule the follow-up appointment.  The format for your next appointment:   In Person  Provider:   Rollene Rotunda, MD

## 2020-10-06 LAB — LIPID PANEL
Chol/HDL Ratio: 2 ratio (ref 0.0–5.0)
Cholesterol, Total: 175 mg/dL (ref 100–199)
HDL: 88 mg/dL (ref >39)
LDL Chol Calc (NIH): 79 mg/dL (ref 0–99)
Triglycerides: 38 mg/dL (ref 0–149)
VLDL Cholesterol Cal: 8 mg/dL (ref 5–40)

## 2020-10-06 LAB — HEPATIC FUNCTION PANEL
ALT: 28 IU/L (ref 0–44)
AST: 34 IU/L (ref 0–40)
Albumin: 4.3 g/dL (ref 3.8–4.8)
Alkaline Phosphatase: 82 IU/L (ref 44–121)
Bilirubin Total: 0.3 mg/dL (ref 0.0–1.2)
Bilirubin, Direct: 0.12 mg/dL (ref 0.00–0.40)
Total Protein: 6.6 g/dL (ref 6.0–8.5)

## 2020-11-11 ENCOUNTER — Other Ambulatory Visit: Payer: Self-pay | Admitting: Cardiology

## 2020-11-13 NOTE — Telephone Encounter (Signed)
68m, 71.1kg, scr 0.62(04/27/20), lovw/hochrein 09/15/20, ccr .

## 2020-11-29 ENCOUNTER — Ambulatory Visit: Payer: Medicare Other | Admitting: Podiatry

## 2020-12-18 ENCOUNTER — Encounter: Payer: Self-pay | Admitting: Podiatry

## 2020-12-18 ENCOUNTER — Ambulatory Visit (INDEPENDENT_AMBULATORY_CARE_PROVIDER_SITE_OTHER): Payer: Medicare Other | Admitting: Podiatry

## 2020-12-18 ENCOUNTER — Other Ambulatory Visit: Payer: Self-pay

## 2020-12-18 DIAGNOSIS — R931 Abnormal findings on diagnostic imaging of heart and coronary circulation: Secondary | ICD-10-CM

## 2020-12-18 DIAGNOSIS — B351 Tinea unguium: Secondary | ICD-10-CM

## 2020-12-18 MED ORDER — TERBINAFINE HCL 250 MG PO TABS: 250.0000 mg | ORAL_TABLET | Freq: Every day | ORAL | 0 refills | Status: DC

## 2020-12-19 NOTE — Progress Notes (Signed)
Subjective:   Patient ID: Carlos Bailey, male   DOB: 70 y.o.   MRN: [id number]   HPI Patient presents concerned about discoloration of approximate 6 months duration left hallux nail stating the distal two thirds of the nailbed is yellow and discolored.  Does not remember significant trauma   ROS      Objective:  Physical Exam  Neurovascular status intact with distal two thirds nail bed left found to be yellow with discoloration no active drainage was noted with patient found to have no pain with pressure     Assessment:  Probability for fungal infection left which also may be trauma and its orientation     Plan:  H&P discussed both conditions explaining the differences.  At this point I have recommended a combined treatment plan consisting of pulse oral antifungal along with laser treatment explaining no guarantee this will get his problems better but he is willing to pursue this route.  Prescription written with education on how to take the medication and will begin utilization of laser in the next several weeks

## 2021-02-06 ENCOUNTER — Telehealth: Payer: Self-pay | Admitting: Cardiology

## 2021-02-06 MED ORDER — OLMESARTAN MEDOXOMIL 40 MG PO TABS: ORAL_TABLET | ORAL | 1 refills | Status: DC

## 2021-02-06 NOTE — Telephone Encounter (Signed)
*  STAT* If patient is at the pharmacy, call can be transferred to refill team.   1. Which medications need to be refilled? (please list name of each medication and dose if known) olmesartan (BENICAR) 40 MG tablet  2. Which pharmacy/location (including street and city if local pharmacy) is medication to be sent to? [id number] [place] - [address] AT [address]  3. Do they need a 30 day or 90 day supply? 90

## 2021-03-26 NOTE — Progress Notes (Signed)
Cardiology Office Note   Date:  03/27/2021   ID:  Carlos Bailey, DOB 1950-09-04, MRN [id number]  PCP:  Rodrigo Ran, MD  Cardiologist:   None Referring:  Rodrigo Ran, MD  Chief Complaint  Patient presents with   Atrial Fibrillation       History of Present Illness: Carlos Bailey is a 70 y.o. male who is referred by Rodrigo Ran, MD for evaluation of AI.   This was moderate to severe.  He has been seen by Dr. Laneta Simmers and Dr. Kizzie Bane at [facility] and is being followed without indication for valve replacement at this point.   He also has atrial fib and an ascending aortic dilatation.    Since I last saw him he has done well.  He swims.  He works with a Psychologist, educational.  He has had no new cardiovascular complaints.  He does not feel his atrial fibrillation. The patient denies any new symptoms such as chest discomfort, neck or arm discomfort. There has been no new shortness of breath, PND or orthopnea. There have been no reported palpitations, presyncope or syncope.   He has follow-up with Dr. Kizzie Bane at [facility] in a few weeks and he is going to get repeat imaging.   Past Medical History:  Diagnosis Date   Aortic insufficiency    Aortic root enlargement (HCC)    Atrial fibrillation (HCC)    HTN (hypertension)    Hyperlipemia    Inguinal hernia    left    Past Surgical History:  Procedure Laterality Date   BACK SURGERY     BUBBLE STUDY  05/03/2020   Procedure: BUBBLE STUDY;  Surgeon: [name], MD;  Location: [facility] ENDOSCOPY;  Service: Cardiology;;   CARDIOVERSION N/A 11/26/2019   Procedure: CARDIOVERSION;  Surgeon: Pricilla Riffle, MD;  Location: [facility] ENDOSCOPY;  Service: Cardiovascular;  Laterality: N/A;   HERNIA REPAIR     RIHR as child   INGUINAL HERNIA REPAIR Left 07/16/2016   Procedure: OPEN LEFT INGUINAL HERNIA REPAIR WITH MESH;  Surgeon: Luretha Murphy, MD;  Location: [facility];  Service: General;  Laterality: Left;   INSERTION OF MESH Left 07/16/2016    Procedure: INSERTION OF MESH;  Surgeon: Luretha Murphy, MD;  Location: [facility];  Service: General;  Laterality: Left;   TEE WITHOUT CARDIOVERSION N/A 05/03/2020   Procedure: TRANSESOPHAGEAL ECHOCARDIOGRAM (TEE);  Surgeon: [name], MD;  Location: [facility] ENDOSCOPY;  Service: Cardiology;  Laterality: N/A;     Current Outpatient Medications  Medication Sig Dispense Refill   amLODipine (NORVASC) 5 MG tablet Take 5 mg by mouth daily.     atorvastatin (LIPITOR) 20 MG tablet Take 20 mg by mouth daily.      Co-Enzyme Q10 100 MG CAPS Take 100 mg by mouth daily.     Multiple Vitamin (MULTIVITAMIN WITH MINERALS) TABS tablet Take 1 tablet by mouth daily. Enhanced Energy Multivitamin     olmesartan (BENICAR) 40 MG tablet TAKE 1 TABLET BY MOUTH EVERY DAY IN THE EVENING 90 tablet 1   XARELTO 20 MG TABS tablet TAKE 1 TABLET (20 MG TOTAL) BY MOUTH DAILY WITH SUPPER. 90 tablet 1   QUNOL COQ10/UBIQUINOL/MEGA PO Take 100 mg by mouth daily. (Patient not taking: Reported on 03/27/2021)     No current facility-administered medications for this visit.    Allergies:   Codeine and Oxycodone-acetaminophen    ROS:  Please see the history of present illness.   Otherwise, review of  systems are positive for NONE.   All other systems are reviewed and negative.    PHYSICAL EXAM: VS:  BP 130/70 (BP Location: Right Arm)   Pulse 81   Ht 5\' 8"  (1.727 m)   Wt 155 lb (70.3 kg)   SpO2 99%   BMI 23.57 kg/m  , BMI Body mass index is 23.57 kg/m. GENERAL:  Well appearing NECK:  No jugular venous distention, waveform within normal limits, carotid upstroke brisk and symmetric, no bruits, no thyromegaly LUNGS:  Clear to auscultation bilaterally CHEST:  Unremarkable HEART:  PMI not displaced or sustained,S1 and S2 within normal limits, no S3, no clicks, no rubs, 3 out of 6 diastolic murmur heard best at the third left intercostal space, 2 out of 6 apical systolic early peaking murmur murmurs,  irregular ABD:  Flat, positive bowel sounds normal in frequency in pitch, no bruits, no rebound, no guarding, no midline pulsatile mass, no hepatomegaly, no splenomegaly EXT:  2 plus pulses throughout, no edema, no cyanosis no clubbing   EKG:  EKG is    ordered today. The ekg ordered today demonstrates atrial fibrillation, rate 81 premature ventricular contractions with bigeminy, left axis deviation, poor anterior R wave progression, no acute ST-T wave changes.   Recent Labs: 04/27/2020: BUN 12; Creatinine, Ser 0.62; Hemoglobin 14.1; Platelets 160; Potassium 4.0; Sodium 137 10/06/2020: ALT 28    Lipid Panel    Component Value Date/Time   CHOL 175 10/06/2020 0828   TRIG 38 10/06/2020 0828   HDL 88 10/06/2020 0828   CHOLHDL 2.0 10/06/2020 0828   LDLCALC 79 10/06/2020 0828      Wt Readings from Last 3 Encounters:  03/27/21 155 lb (70.3 kg)  09/15/20 156 lb 12.8 oz (71.1 kg)  05/24/20 149 lb 12.8 oz (67.9 kg)      Other studies Reviewed: Additional studies/ records that were reviewed today include: Labs Review of the above records demonstrates: See elsewhere   ASSESSMENT AND PLAN:  AORTIC INSUFFICIENCY:  This was moderate to severe.  He is asymptomatic.  (Class B to C1).   The EF is 50%.   He has had two surgical opinions with Dr. 07/24/20 and Dr. Laneta Simmers at [facility] and he will continue to be followed without need for surgery at this point.   I will follow new imaging results when they are available.   ATRIAL FIB:    Mr. Carlos Bailey has a CHA2DS2 - VASc score of 2.  No change in therapy.    ELEVATED CORONARY CALCIUM:   He had a negative adequate POET (Plain Old Exercise Treadmill) in 2020.  Continue primary risk reduction.    ASCENDING ANEURYSM:  4.7 cm.  This will be evaluated as above.  HTN: His blood pressure is at target and I reviewed some blood pressure diary readings.  It is at times a little bit high and he will let me know and I would go up on his amlodipine if  needed.    RISK REDUCTION:  His MESA score is 15.4.   He is participating in risk reduction.  DYSLIPIDEMIA: LDL is 71 with an HDL of 80.  No change in therapy.    Current medicines are reviewed at length with the patient today.  The patient does not have concerns regarding medicines.  The following changes have been made:  None  Labs/ tests ordered today include: None  Orders Placed This Encounter  Procedures   EKG 12-Lead      Disposition:  FU with in 3 months.   Signed, Rollene Rotunda, MD  03/27/2021 4:12 PM    [facility]

## 2021-03-27 ENCOUNTER — Ambulatory Visit (INDEPENDENT_AMBULATORY_CARE_PROVIDER_SITE_OTHER): Payer: Medicare Other | Admitting: Cardiology

## 2021-03-27 ENCOUNTER — Encounter: Payer: Self-pay | Admitting: Cardiology

## 2021-03-27 ENCOUNTER — Other Ambulatory Visit: Payer: Self-pay

## 2021-03-27 VITALS — BP 130/70 | HR 81 | Ht 68.0 in | Wt 155.0 lb

## 2021-03-27 DIAGNOSIS — I10 Essential (primary) hypertension: Secondary | ICD-10-CM

## 2021-03-27 DIAGNOSIS — I712 Thoracic aortic aneurysm, without rupture: Secondary | ICD-10-CM | POA: Diagnosis not present

## 2021-03-27 DIAGNOSIS — I351 Nonrheumatic aortic (valve) insufficiency: Secondary | ICD-10-CM | POA: Diagnosis not present

## 2021-03-27 DIAGNOSIS — I4819 Other persistent atrial fibrillation: Secondary | ICD-10-CM

## 2021-03-27 DIAGNOSIS — I7121 Aneurysm of the ascending aorta, without rupture: Secondary | ICD-10-CM

## 2021-03-27 DIAGNOSIS — R931 Abnormal findings on diagnostic imaging of heart and coronary circulation: Secondary | ICD-10-CM | POA: Diagnosis not present

## 2021-03-27 NOTE — Patient Instructions (Signed)
Medication Instructions:  Continue current medications  *If you need a refill on your cardiac medications before your next appointment, please call your pharmacy*   Lab Work: None Ordered   Testing/Procedures: None Ordered   Follow-Up: At [facility], you and your health needs are our priority.  As part of our continuing mission to provide you with exceptional heart care, we have created designated Provider Care Teams.  These Care Teams include your primary Cardiologist (physician) and Advanced Practice Providers (APPs -  Physician Assistants and Nurse Practitioners) who all work together to provide you with the care you need, when you need it.  We recommend signing up for the patient portal called "MyChart".  Sign up information is provided on this After Visit Summary.  MyChart is used to connect with patients for Virtual Visits (Telemedicine).  Patients are able to view lab/test results, encounter notes, upcoming appointments, etc.  Non-urgent messages can be sent to your provider as well.   To learn more about what you can do with MyChart, go to [email or web address].    Your next appointment:   6 month(s)  The format for your next appointment:   In Person  Provider:   You may see James Hochrein, MD or one of the following Advanced Practice Providers on your designated Care Team:   Rhonda Barrett, PA-C Jennifer, Lambert, PA-C Kathryn Lawrence, DNP, ANP    

## 2021-04-30 ENCOUNTER — Other Ambulatory Visit: Payer: Self-pay | Admitting: Surgery

## 2021-04-30 DIAGNOSIS — I712 Thoracic aortic aneurysm, without rupture, unspecified: Secondary | ICD-10-CM

## 2021-05-13 ENCOUNTER — Other Ambulatory Visit: Payer: Self-pay | Admitting: Cardiology

## 2021-05-14 NOTE — Telephone Encounter (Signed)
SCr was 0.6 on 06/05/20 at [facility] in Care everywhere, CrCl > 100, ok to refill. I added note on next cards appt in Feb to recheck labs

## 2021-05-14 NOTE — Telephone Encounter (Signed)
Pt overdue for labs routing to pharmd pool for permission to order labs

## 2021-05-14 NOTE — Telephone Encounter (Signed)
Prescription refill request for Xarelto received.   Indication: afib  Last office visit: Hochrein, 03/27/2021 Weight: 70.3 kg  Age: 70 yo  Scr: 0.62, 04/27/2020 CrCl: 110 ml/min  Pt is overdue for labs.

## 2021-06-06 ENCOUNTER — Other Ambulatory Visit: Payer: Medicare Other

## 2021-06-06 ENCOUNTER — Ambulatory Visit: Payer: Medicare Other | Admitting: Surgery

## 2021-07-26 IMAGING — CT CT CTA ABD/PEL W/CM AND/OR W/O CM
2 of 7 series · 16 of 36 positions shown · non-contrast
Comparison: None.

CLINICAL DATA: Ascending aortic aneurysm.

EXAM:
CT ANGIOGRAPHY CHEST, ABDOMEN AND PELVIS
TECHNIQUE: Non-contrast CT of the chest was initially obtained.

[Series 5: cta cap aneurysm 2.00 bv36 s3 axial arterial · axial · arterial · 0.76mm/px · z∈[+1232,+1776]mm · 15 of 310 slices shown]
[im 19/310  lung]
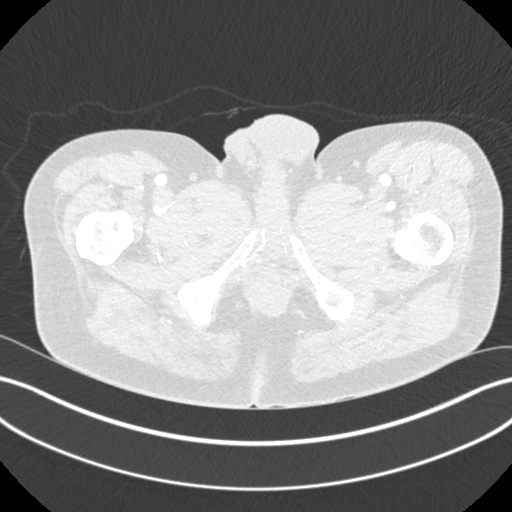
[im 37/310  mediastinal]
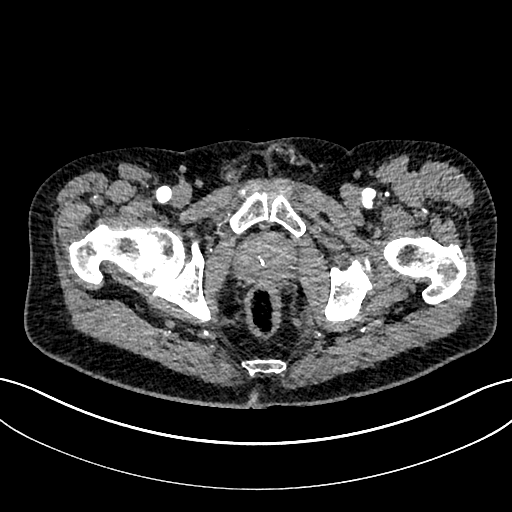
[im 55/310  lung]
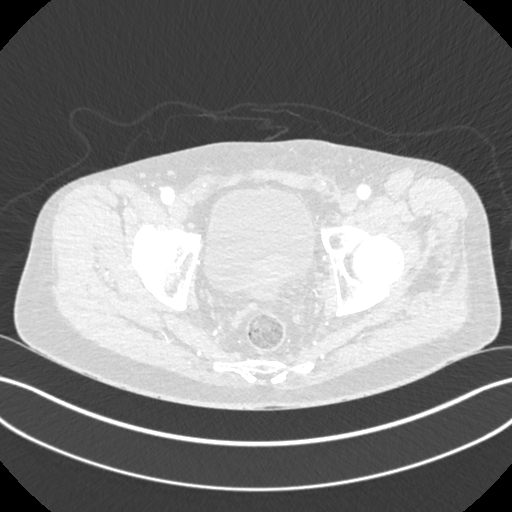
[im 73/310  mediastinal]
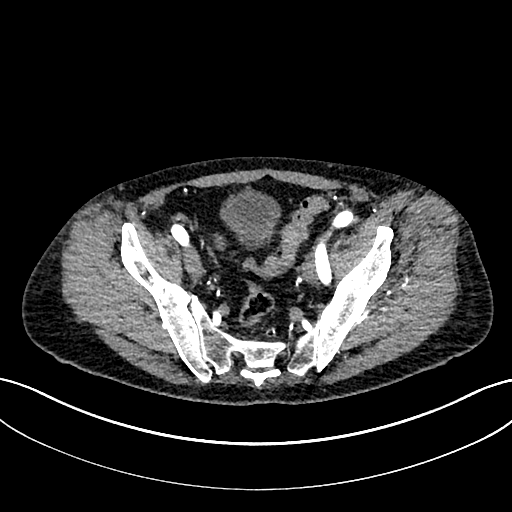
[im 91/310  lung]
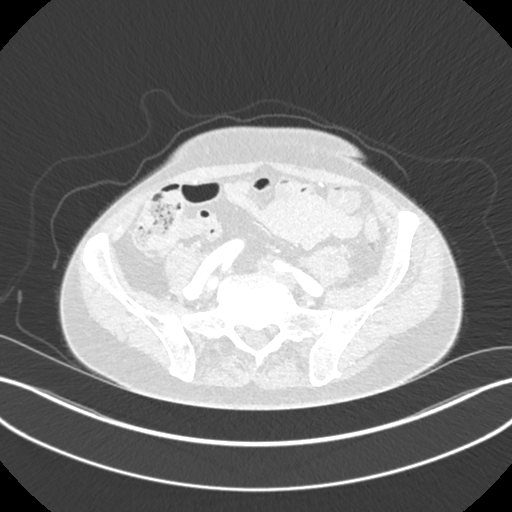
[im 110/310  mediastinal]
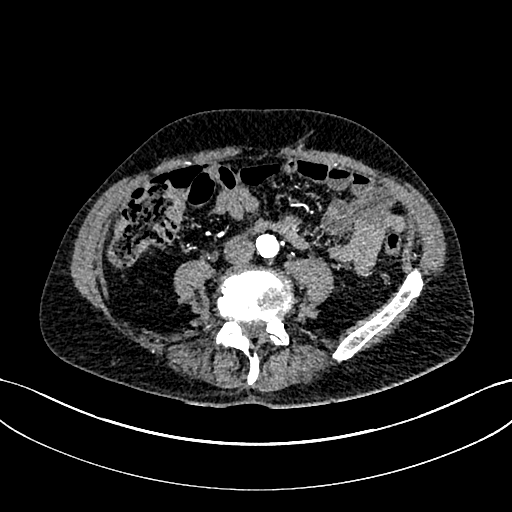
[im 128/310  lung]
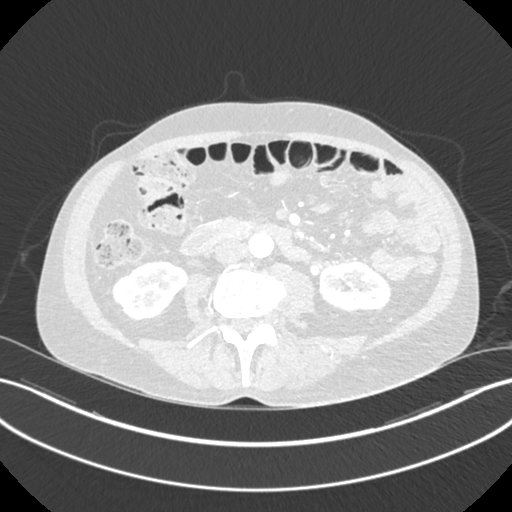
[im 164/310  mediastinal]
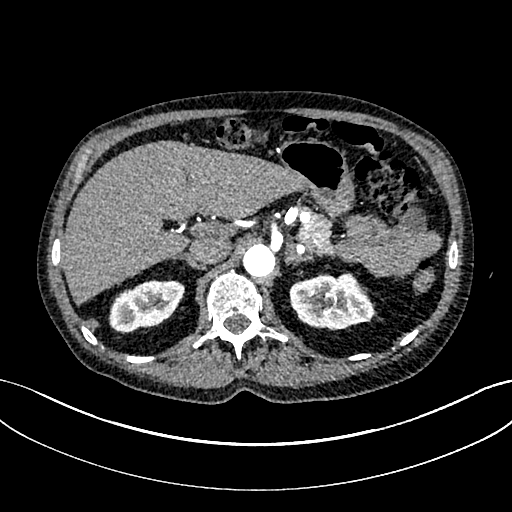
[im 182/310  lung]
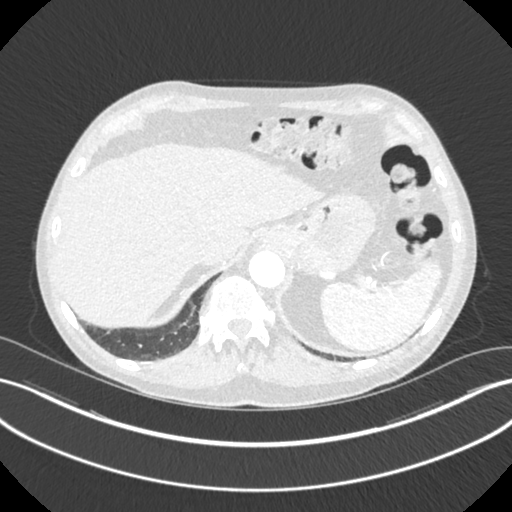
[im 200/310  mediastinal]
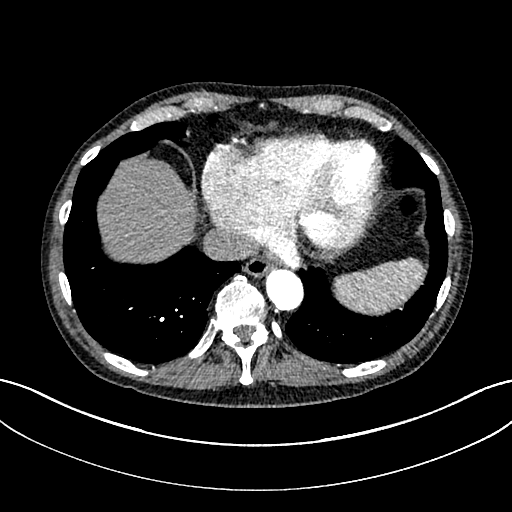
[im 219/310  lung]
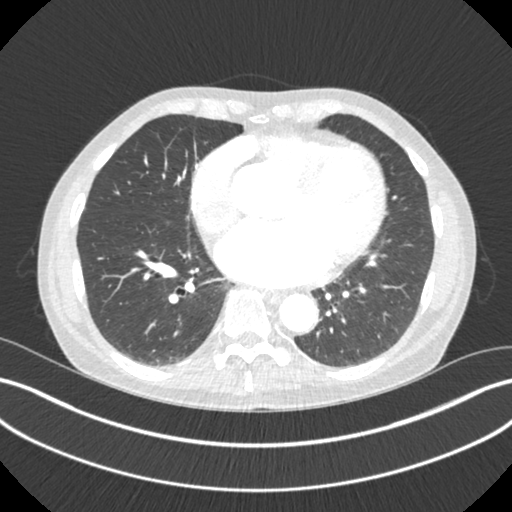
[im 237/310  mediastinal]
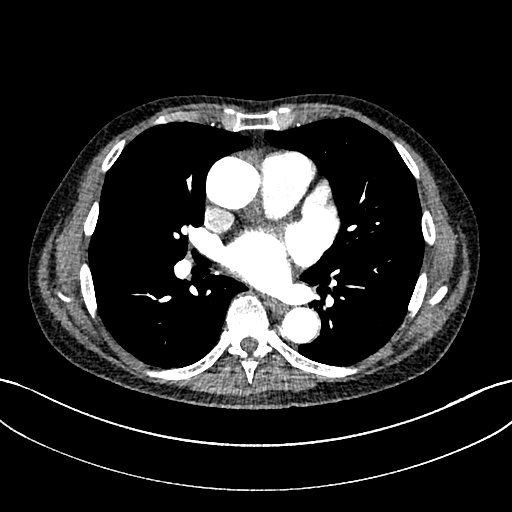
[im 255/310  lung]
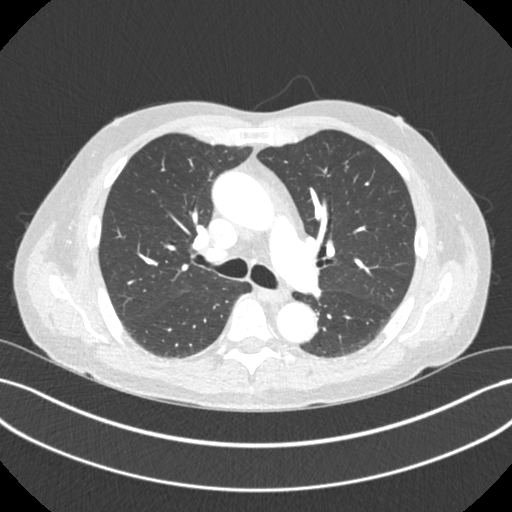
[im 273/310  mediastinal]
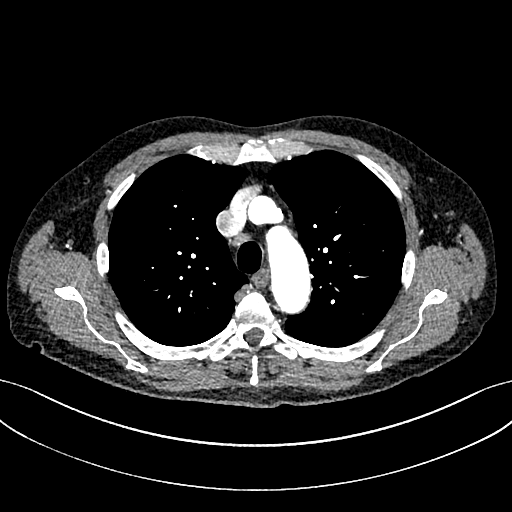
[im 291/310  lung]
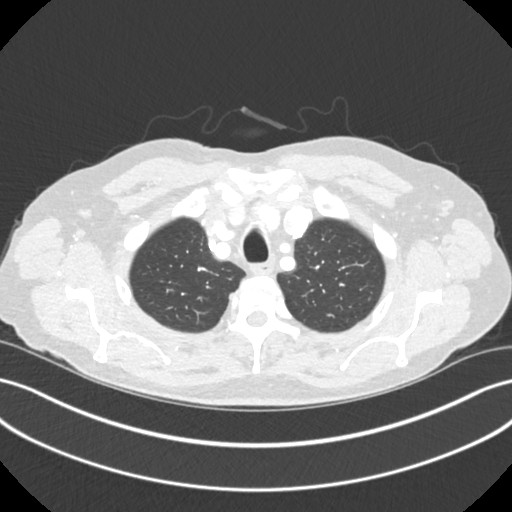

[Series 7: cta cap aneurysm 2.00 bv36 s3 cor st · coronal · 0.76mm/px · 1 of 127 slices shown]
[im 64/127  mediastinal]
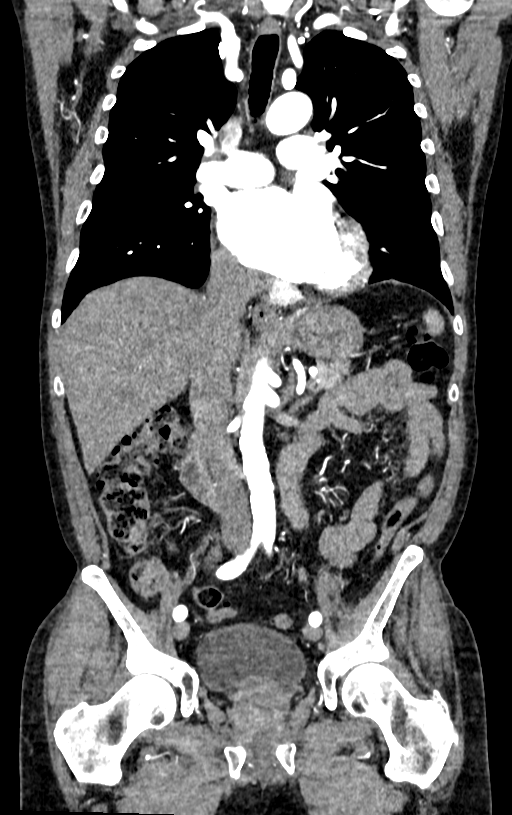

[16 of 36 positions shown; findings below may reference images not displayed]

Multidetector CT imaging through the chest, abdomen and pelvis was
performed using the standard protocol during bolus administration of
intravenous contrast. Multiplanar reconstructed images and MIPs were
obtained and reviewed to evaluate the vascular anatomy.

CONTRAST:  75mL FNQV9R-TDO IOPAMIDOL (FNQV9R-TDO) INJECTION 76%
FINDINGS: CTA CHEST FINDINGS

Cardiovascular: 4 cm ascending thoracic aortic aneurysm is noted. No
dissection is noted. Great vessels are widely patent. Normal cardiac
size. No pericardial effusion. Transverse aortic arch measures
cm. Proximal descending thoracic aorta measures 2.9 cm.

Mediastinum/Nodes: No enlarged mediastinal, hilar, or axillary lymph
nodes. Thyroid gland, trachea, and esophagus demonstrate no
significant findings.

Lungs/Pleura: Lungs are clear. No pleural effusion or pneumothorax.

Musculoskeletal: No chest wall abnormality. No acute or significant
osseous findings.

Review of the MIP images confirms the above findings.

CTA ABDOMEN AND PELVIS FINDINGS

VASCULAR

Aorta: Atherosclerosis of abdominal aorta is noted without aneurysm
or dissection.

Celiac: Patent without evidence of dissection, vasculitis or
significant stenosis. However, mild fusiform aneurysmal dilatation
of the distal portion of the main trunk is noted measuring 11 mm.

SMA: Patent without evidence of aneurysm, dissection, vasculitis or
significant stenosis.

Renals: Single right renal artery is noted, with 2 left renal
arteries being present. No significant stenosis or dissection is
seen involving either side.

IMA: Patent without evidence of aneurysm, dissection, vasculitis or
significant stenosis.

Inflow: Patent without evidence of aneurysm, dissection, vasculitis
or significant stenosis.

Veins: No obvious venous abnormality within the limitations of this
arterial phase study.

Review of the MIP images confirms the above findings.

NON-VASCULAR

Hepatobiliary: No focal liver abnormality is seen. No gallstones,
gallbladder wall thickening, or biliary dilatation.

Pancreas: Unremarkable. No pancreatic ductal dilatation or
surrounding inflammatory changes.

Spleen: Normal in size without focal abnormality.

Adrenals/Urinary Tract: Adrenal glands are unremarkable. Kidneys are
normal, without renal calculi, focal lesion, or hydronephrosis.
Bladder is unremarkable.

Stomach/Bowel: Stomach is within normal limits. Appendix appears
normal. No evidence of bowel wall thickening, distention, or
inflammatory changes.

Lymphatic: No significant adenopathy is noted.

Reproductive: Mild prostatic enlargement is noted.

Other: No abdominal wall hernia or abnormality. No abdominopelvic
ascites.

Musculoskeletal: No acute or significant osseous findings.

Review of the MIP images confirms the above findings.
IMPRESSION: 1. 4 cm ascending thoracic aortic aneurysm is noted. No dissection
is noted. Recommend annual imaging followup by CTA or MRA. This
recommendation follows 6646
ACCF/AHA/AATS/ACR/ASA/SCA/MARC DUMEL/BRONSON/JIM/HENRIQUES Guidelines for the
Diagnosis and Management of Patients with Thoracic Aortic Disease.
Circulation. 6646; 121: E266-e369. Aortic aneurysm NOS
(DY4O4-JJE.U).
2. Atherosclerosis of abdominal aorta is noted without aneurysm or
dissection.
3. Mild fusiform aneurysmal dilatation of distal portion of main
trunk of celiac artery is noted measuring 11 mm. Attention to this
finding on follow-up imaging is recommended.
4. Mild prostatic enlargement.

Aortic Atherosclerosis (DY4O4-ZE0.0).

## 2021-08-06 ENCOUNTER — Other Ambulatory Visit: Payer: Self-pay | Admitting: Cardiology

## 2021-08-16 ENCOUNTER — Telehealth: Payer: Self-pay | Admitting: Cardiology

## 2021-08-16 NOTE — Telephone Encounter (Signed)
°*  STAT* If patient is at the pharmacy, call can be transferred to refill team.   1. Which medications need to be refilled? (please list name of each medication and dose if known) Xarelto  2. Which pharmacy/location (including street and city if local pharmacy) is medication to be sent to? [facility] [address] [place], [place]  3. Do they need a 30 day or 90 day supply? 90 days and refills

## 2021-08-16 NOTE — Telephone Encounter (Addendum)
Prescription refill request for Xarelto received.  Indication: afib  Last office visit: Hochrein, 09/15/2020 Weight:70.3 kg  Age: 70 yo  Scr: 0.62, 04/27/2020 CrCl: 110 ml/min   Looks like pt could have done labs in March 2022 per [facility]. Called pt's PCP office and LMOM to medical records requesting labs be sent over that have been done in the past year. However,  per last refill request from [facility] 9/26/222    Note   SCr was 0.6 on 06/05/20 at [facility] in Care everywhere, CrCl > 100, ok to refill. I added note on next cards appt in Feb to recheck labs

## 2021-08-17 MED ORDER — RIVAROXABAN 20 MG PO TABS
20.0000 mg | ORAL_TABLET | Freq: Every day | ORAL | 0 refills | Status: DC
Start: 2021-08-17 — End: 2022-02-06

## 2021-08-17 NOTE — Telephone Encounter (Signed)
0.6, 10/24/2020 from [facility].   CrCl:179ml/min

## 2021-09-26 NOTE — Progress Notes (Signed)
Cardiology Office Note   Date:  09/27/2021   ID:  Carlos Bailey, DOB [birth year], MRN [id number]  PCP:  Rodrigo RanPerini, Mark, MD  Cardiologist:   Rollene RotundaJames Maday Guarino, MD Referring:  Rodrigo RanPerini, Mark, MD  Chief Complaint  Patient presents with   Aortic Insufficiency       History of Present Illness: Carlos Bailey is a 71 y.o. male who is referred by Rodrigo RanPerini, Mark, MD for evaluation of AI.   This was moderate to severe.  He has been seen by Dr. Laneta SimmersBartle and Dr. [facility]hes [facility] and is being followed without indication for valve replacement at this point.   He also has atrial fib and an ascending aortic dilatation.    Since I last saw him [facility] and had [facility].  He is aorta is as listed below.  His aortic insufficiency was actually listed as mild.  His EF is 50%.  He is still swimming 3 days a week.  He is working out 2 other days a week. The patient denies any new symptoms such as chest discomfort, neck or arm discomfort. There has been no new shortness of breath, PND or orthopnea. There have been no reported palpitations, presyncope or syncope.   EKG he has follow-up with [facility] in a few weeks and he is going to get repeat imaging.   Past Medical History:  Diagnosis Date   Aortic insufficiency    Aortic root enlargement (HCC)    Atrial fibrillation (HCC)    HTN (hypertension)    Hyperlipemia    Inguinal hernia    left    Past Surgical History:  Procedure Laterality Date   BACK SURGERY     BUBBLE STUDY  05/03/2020   Procedure: BUBBLE STUDY;  Surgeon: Parke PoissonAcharya, Gayatri A, MD;  [facility];  Service: Cardiology;;   CARDIOVERSION N/A 11/26/2019   Procedure: CARDIOVERSION;  Surgeon: Pricilla Riffleoss, Paula V, MD;  [facility];  Service: Cardiovascular;  Laterality: N/A;   HERNIA REPAIR     RIHR as child   INGUINAL HERNIA REPAIR Left 07/16/2016   Procedure: OPEN LEFT INGUINAL HERNIA REPAIR WITH MESH;  Surgeon: Luretha MurphyMatthew Martin, MD;  Location: [facility];  Service: General;  Laterality: Left;   INSERTION OF MESH Left 07/16/2016   Procedure: INSERTION OF MESH;  Surgeon: Luretha MurphyMatthew Martin, MD;  Location: [facility];  Service: General;  Laterality: Left;   TEE WITHOUT CARDIOVERSION N/A 05/03/2020   Procedure: TRANSESOPHAGEAL ECHOCARDIOGRAM (TEE);  Surgeon: Parke PoissonAcharya, Gayatri A, MD;  [facility];  Service: Cardiology;  Laterality: N/A;     Current Outpatient Medications  Medication Sig Dispense Refill   amLODipine (NORVASC) 5 MG tablet Take 5 mg by mouth daily.     atorvastatin (LIPITOR) 20 MG tablet Take 20 mg by mouth daily.      Co-Enzyme Q10 100 MG CAPS Take 100 mg by mouth daily.     Multiple Vitamin (MULTIVITAMIN WITH MINERALS) TABS tablet Take 1 tablet by mouth daily. Enhanced [employer] Multivitamin     olmesartan (BENICAR) 40 MG tablet TAKE 1 TABLET BY MOUTH EVERY DAY IN THE EVENING 90 tablet 1   QUNOL COQ10/UBIQUINOL/MEGA PO Take 100 mg by mouth daily.     rivaroxaban (XARELTO) 20 MG TABS tablet Take 1 tablet (20 mg total) by mouth daily with supper. 90 tablet 0   No current facility-administered medications for this visit.    Allergies:   Codeine  and Oxycodone-acetaminophen    ROS:  Please see the history of present illness.   Otherwise, review of systems are positive for none  PHYSICAL EXAM: VS:  BP (!) 146/70    Pulse 83    Ht 5\' 8"  (1.727 m)    Wt 161 lb 12.8 oz (73.4 kg)    SpO2 99%    BMI 24.60 kg/m  , BMI Body mass index is 24.6 kg/m. GENERAL:  Well appearing NECK:  No jugular venous distention, waveform within normal limits, carotid upstroke brisk and symmetric, no bruits, no thyromegaly LUNGS:  Clear to auscultation bilaterally CHEST:  Unremarkable HEART:  PMI not displaced or sustained,S1 and S2 within normal limits, no S3, no clicks, no rubs, 2 out of 6 apical diastolic murmur late peaking, no systolic murmurs, irregular ABD:  Flat, positive bowel sounds normal in frequency in pitch,  no bruits, no rebound, no guarding, no midline pulsatile mass, no hepatomegaly, no splenomegaly EXT:  2 plus pulses throughout, no edema, no cyanosis no clubbing   EKG:  EKG is  not ordered today. The ekg ordered 03/29/2021 demonstrates atrial fibrillation, rate 81 premature ventricular contractions with bigeminy, left axis deviation, poor anterior R wave progression, no acute ST-T wave changes.   Recent Labs: 10/06/2020: ALT 28    Lipid Panel    Component Value Date/Time   CHOL 175 10/06/2020 0828   TRIG 38 10/06/2020 0828   HDL 88 10/06/2020 0828   CHOLHDL 2.0 10/06/2020 0828   LDLCALC 79 10/06/2020 0828      Wt Readings from Last 3 Encounters:  09/27/21 161 lb 12.8 oz (73.4 kg)  03/27/21 155 lb (70.3 kg)  09/15/20 156 lb 12.8 oz (71.1 kg)      Other studies Reviewed: Additional studies/ records that were reviewed today include:  [facility] Records Review of the above records demonstrates: See elsewhere   ASSESSMENT AND PLAN:  AORTIC INSUFFICIENCY:  This was moderate to severe.  He is asymptomatic.  (Class B to C1).   The EF is 50%.  I am going to check an echocardiogram as we have not done this since 2021.  I suspect we will be able to follow this clinically as judged by the MRI.     ATRIAL FIB:    Carlos Bailey has a CHA2DS2 - VASc score of 2.  He tolerates anticoagulation and has had good rate control.  No change in therapy.   ELEVATED CORONARY CALCIUM: He had a negative POET (Plain Old Exercise Treadmill) in 2020 and has no symptoms.  No change in therapy.   ASCENDING ANEURYSM:  4.4 cm.  He is going to get a follow up MRI around May of 2024.  He is followed at  [facility].   HTN: His blood pressure is slightly elevated in the 130s typically at home.  It is higher today.  He is going to keep a blood pressure diary and if he is not in the 120s over 70s I will probably increase his amlodipine.   DYSLIPIDEMIA: LDL is 71 with an HDL of 80.  This was in March of last year.   No change in therapy.    Current medicines are reviewed at length with the patient today.  The patient does not have concerns regarding medicines.  The following changes have been made:  None  Labs/ tests ordered today include: Echo  Orders Placed This Encounter  Procedures   ECHOCARDIOGRAM COMPLETE     Disposition:   FU with  in 12 months.   Signed, Minus Breeding, MD  09/27/2021 11:00 AM    [place]

## 2021-09-27 ENCOUNTER — Other Ambulatory Visit: Payer: Self-pay

## 2021-09-27 ENCOUNTER — Ambulatory Visit: Payer: Medicare Other | Admitting: Cardiology

## 2021-09-27 ENCOUNTER — Ambulatory Visit (INDEPENDENT_AMBULATORY_CARE_PROVIDER_SITE_OTHER): Payer: Medicare Other | Admitting: Cardiology

## 2021-09-27 ENCOUNTER — Encounter: Payer: Self-pay | Admitting: Cardiology

## 2021-09-27 VITALS — BP 146/70 | HR 83 | Ht 68.0 in | Wt 161.8 lb

## 2021-09-27 DIAGNOSIS — I4819 Other persistent atrial fibrillation: Secondary | ICD-10-CM | POA: Diagnosis not present

## 2021-09-27 DIAGNOSIS — I10 Essential (primary) hypertension: Secondary | ICD-10-CM

## 2021-09-27 DIAGNOSIS — I351 Nonrheumatic aortic (valve) insufficiency: Secondary | ICD-10-CM

## 2021-09-27 DIAGNOSIS — I7121 Aneurysm of the ascending aorta, without rupture: Secondary | ICD-10-CM

## 2021-09-27 NOTE — Patient Instructions (Addendum)
Your physician has requested that you have an echocardiogram. Echocardiography is a painless test that uses sound waves to create images of your heart. It provides your doctor with information about the size and shape of your heart and how well your hearts chambers and valves are working. This procedure takes approximately one hour. There are no restrictions for this procedure. [address]   Follow-Up: At [facility], you and your health needs are our priority.  As part of our continuing mission to provide you with exceptional heart care, we have created designated Provider Care Teams.  These Care Teams include your primary Cardiologist (physician) and Advanced Practice Providers (APPs -  Physician Assistants and Nurse Practitioners) who all work together to provide you with the care you need, when you need it.  We recommend signing up for the patient portal called "MyChart".  Sign up information is provided on this After Visit Summary.  MyChart is used to connect with patients for Virtual Visits (Telemedicine).  Patients are able to view lab/test results, encounter notes, upcoming appointments, etc.  Non-urgent messages can be sent to your provider as well.   To learn more about what you can do with MyChart, go to [email or web address].    Your next appointment:   12 month(s)  The format for your next appointment:   In Person  Provider:   Rollene Rotunda MD    Other Instructions  TRACK BLOOD PRESSURE ONCE DAILY 1-2 HOURS AFTER TAKING MEDICATIONS

## 2021-10-08 ENCOUNTER — Other Ambulatory Visit: Payer: Self-pay

## 2021-10-08 ENCOUNTER — Ambulatory Visit (HOSPITAL_COMMUNITY): Payer: Medicare Other | Attending: Cardiovascular Disease

## 2021-10-08 DIAGNOSIS — I351 Nonrheumatic aortic (valve) insufficiency: Secondary | ICD-10-CM

## 2021-10-08 LAB — ECHOCARDIOGRAM COMPLETE
AR max vel: 4.86 cm2
AV Area VTI: 4.92 cm2
AV Area mean vel: 4.76 cm2
AV Mean grad: 2.7 mmHg
AV Peak grad: 4.8 mmHg
Ao pk vel: 1.09 m/s
P 1/2 time: 374 msec
S' Lateral: 3.4 cm

## 2021-11-19 ENCOUNTER — Other Ambulatory Visit: Payer: Self-pay | Admitting: Cardiology

## 2021-12-12 ENCOUNTER — Other Ambulatory Visit: Payer: Self-pay | Admitting: Cardiology

## 2021-12-13 NOTE — Telephone Encounter (Addendum)
Spoke with pt, aware he does not need antibiotics before the dentist anymore. ? ? ?agree ?

## 2021-12-13 NOTE — Addendum Note (Signed)
Addended by: Cristopher Estimable on: 12/13/2021 01:54 PM ? ? Modules accepted: Orders ? ?

## 2022-02-05 ENCOUNTER — Other Ambulatory Visit: Payer: Self-pay | Admitting: Cardiology

## 2022-02-05 DIAGNOSIS — I4819 Other persistent atrial fibrillation: Secondary | ICD-10-CM

## 2022-02-05 NOTE — Telephone Encounter (Signed)
Xarelto 20mg  refill request received. Pt is 71 years old, weight-73.4kg, Crea-0.70 on 12/10/2021 via faxed labs from PCP, last seen by Dr. 12/12/2021 on 09/27/2021, Diagnosis-Afib, CrCl-101.12ml/min; Dose is appropriate based on dosing criteria. Will send in refill to requested pharmacy.    Called PCP and they will fax over the labs done in April 2023.   02/06/2022-Labs received from PCP, Crea-0.70 on 12/10/2021; will send refill request.

## 2022-04-11 ENCOUNTER — Ambulatory Visit (INDEPENDENT_AMBULATORY_CARE_PROVIDER_SITE_OTHER): Payer: Medicare Other | Admitting: Podiatry

## 2022-04-11 ENCOUNTER — Encounter: Payer: Self-pay | Admitting: Podiatry

## 2022-04-11 DIAGNOSIS — B351 Tinea unguium: Secondary | ICD-10-CM | POA: Diagnosis not present

## 2022-04-11 NOTE — Progress Notes (Signed)
Subjective:   Patient ID: Carlos Bailey, male   DOB: 71 y.o.   MRN: [id number]   HPI Patient is presenting concerned about the left big toenail that we worked on in the past this become partially loose and is still discolored   ROS      Objective:  Physical Exam  Neurovascular status intact with a loose hallux nail left distal one third discoloration down to the base no current pain in the proximal portion is quite adhered     Assessment:  Damage secondary to probable trauma with nailbed that is long-term probably not can to be healthy     Plan:  H&P reviewed do not recommend more aggressive treatment but ultimately nail removal may be necessary and I discussed permanent versus temporary.  Long-term most likely will require permanent procedure

## 2022-04-16 ENCOUNTER — Ambulatory Visit: Payer: Medicare Other | Admitting: Podiatry

## 2022-05-31 DIAGNOSIS — H6123 Impacted cerumen, bilateral: Secondary | ICD-10-CM | POA: Insufficient documentation

## 2022-08-23 ENCOUNTER — Telehealth: Payer: Self-pay | Admitting: Cardiology

## 2022-08-23 MED ORDER — AMLODIPINE BESYLATE 5 MG PO TABS: 5.0000 mg | ORAL_TABLET | Freq: Every day | ORAL | 3 refills | Status: DC

## 2022-08-23 NOTE — Telephone Encounter (Signed)
*  STAT* If patient is at the pharmacy, call can be transferred to refill team.   1. Which medications need to be refilled? (please list name of each medication and dose if known)  amLODipine (NORVASC) 5 MG tablet  2. Which pharmacy/location (including street and city if local pharmacy) is medication to be sent to? [id number] [place], [place] - [address] AT [address]   3. Do they need a 30 day or 90 day supply?  90 day supply

## 2022-11-27 ENCOUNTER — Other Ambulatory Visit: Payer: Self-pay | Admitting: Cardiology

## 2022-11-27 DIAGNOSIS — I4819 Other persistent atrial fibrillation: Secondary | ICD-10-CM

## 2022-11-27 NOTE — Telephone Encounter (Signed)
Prescription refill request for Xarelto received.  Indication: a fib Last office visit: 09/27/21 overdue Weight: 161 Age: 72 Scr: 0.7 12/10/21 media tab CrCl: 100 mL/min

## 2022-12-16 ENCOUNTER — Telehealth: Payer: Self-pay | Admitting: Cardiology

## 2022-12-16 NOTE — Telephone Encounter (Signed)
Patient is returning call. Requesting call back 

## 2022-12-16 NOTE — Telephone Encounter (Signed)
Called patient to schedule overdue f/u with Dr. Antoine Poche. He states that he has an appt with [facility] sometime in May and wants to know if he should see Dr. Antoine Poche before or after that appt. Please advise.

## 2022-12-16 NOTE — Telephone Encounter (Signed)
Spoke to patient-appt with [facility] 5/17  Scheduled follow up with Dr. Antoine Poche 5/23. Patient aware.

## 2022-12-16 NOTE — Telephone Encounter (Signed)
Left message to call back  

## 2023-01-08 NOTE — Progress Notes (Unsigned)
  Cardiology Office Note:   Date:  01/09/2023  ID:  HYDEN THAYN, DOB [birth year], MRN [id number]  History of Present Illness:   Carlos Bailey is a 72 y.o. male who is referred by Rodrigo Ran, MD for evaluation of AI.   This was moderate to severe.  He has been seen by Dr. Laneta Simmers and Dr. Kizzie Bane at [facility] and is being followed without indication for valve replacement at this point.   He also has atrial fi b and an ascending aortic dilatation.   He has moderate AI noted on MRI at [facility] in Nov 2024.   There was a mildly enlarged aortic root at 44 mm.  This was unchanged from previous.  Overall it was thought that the aortic regurgitation is relatively unchanged.  I reviewed these results for this visit.  He is swimming daily.  The patient denies any new symptoms such as chest discomfort, neck or arm discomfort. There has been no new shortness of breath, PND or orthopnea. There have been no reported palpitations, presyncope or syncope.    ROS: As stated in the HPI and negative for all other systems.  Studies Reviewed:    EKG: Fibrillation, rate 87, left axis deviation, left anterior fascicular block, premature ventricular contraction, poor anterior R wave progression, no acute ST-T wave changes.   Risk Assessment/Calculations:    CHA2DS2-VASc Score = 2   This indicates a 2.2% annual risk of stroke. The patient's score is based upon: CHF History: 0 HTN History: 1 Diabetes History: 0 Stroke History: 0 Vascular Disease History: 0 Age Score: 1 Gender Score: 0   Physical Exam:   VS:  BP (!) 142/78 (BP Location: Left Arm, Patient Position: Sitting, Cuff Size: Normal)   Pulse 87   Ht 5\' 8"  (1.727 m)   Wt 160 lb 9.6 oz (72.8 kg)   SpO2 97%   BMI 24.42 kg/m    Wt Readings from Last 3 Encounters:  01/09/23 160 lb 9.6 oz (72.8 kg)  09/27/21 161 lb 12.8 oz (73.4 kg)  03/27/21 155 lb (70.3 kg)     GEN: Well nourished, well developed in no acute distress NECK: No JVD; No carotid  bruits CARDIAC: Irregular RR, 3/6 apcial diastolic murmur, no systolic murmurs, rubs, gallops RESPIRATORY:  Clear to auscultation without rales, wheezing or rhonchi  ABDOMEN: Soft, non-tender, non-distended EXTREMITIES:  No edema; No deformity   ASSESSMENT AND PLAN:   AORTIC INSUFFICIENCY:  This was moderate.  He has a virtual visit with [facility] tomorrow.  He is asymptomatic class B to C1.    ATRIAL FIB:    Mr. REHMAN GROESBECK has a CHA2DS2 - VASc score of 2.  He tolerates anticoagulation.  No change in therapy   ELEVATED CORONARY CALCIUM: He had a negative POET (Plain Old Exercise Treadmill) in 2020 .  He has no new symptoms.  No change in therapy.    ASCENDING ANEURYSM:  4.4 cm.  Again this is being followed by MRI as above.   HTN: His blood pressure is slightly elevated but this is very unusual and he typically has systolics in the 120s at home.   No change in therapy.   DYSLIPIDEMIA: LDL is  74 with HDL of 67.  No change in therapy.         Signed, Rollene Rotunda, MD

## 2023-01-09 ENCOUNTER — Encounter: Payer: Self-pay | Admitting: Cardiology

## 2023-01-09 ENCOUNTER — Ambulatory Visit: Payer: Medicare Other | Attending: Cardiology | Admitting: Cardiology

## 2023-01-09 VITALS — BP 142/78 | HR 87 | Ht 68.0 in | Wt 160.6 lb

## 2023-01-09 DIAGNOSIS — I351 Nonrheumatic aortic (valve) insufficiency: Secondary | ICD-10-CM | POA: Diagnosis not present

## 2023-01-09 NOTE — Patient Instructions (Signed)
   Follow-Up: At [facility], you and your health needs are our priority.  As part of our continuing mission to provide you with exceptional heart care, we have created designated Provider Care Teams.  These Care Teams include your primary Cardiologist (physician) and Advanced Practice Providers (APPs -  Physician Assistants and Nurse Practitioners) who all work together to provide you with the care you need, when you need it.  We recommend signing up for the patient portal called "MyChart".  Sign up information is provided on this After Visit Summary.  MyChart is used to connect with patients for Virtual Visits (Telemedicine).  Patients are able to view lab/test results, encounter notes, upcoming appointments, etc.  Non-urgent messages can be sent to your provider as well.   To learn more about what you can do with MyChart, go to [email or web address].    Your next appointment:   12 month(s)  Provider:   James Hochrein, MD      

## 2023-01-18 ENCOUNTER — Other Ambulatory Visit: Payer: Self-pay | Admitting: Cardiology

## 2023-02-23 ENCOUNTER — Other Ambulatory Visit: Payer: Self-pay | Admitting: Cardiology

## 2023-02-23 ENCOUNTER — Encounter: Payer: Self-pay | Admitting: Cardiology

## 2023-02-23 DIAGNOSIS — I4819 Other persistent atrial fibrillation: Secondary | ICD-10-CM

## 2023-02-24 ENCOUNTER — Other Ambulatory Visit: Payer: Self-pay

## 2023-02-24 DIAGNOSIS — I4819 Other persistent atrial fibrillation: Secondary | ICD-10-CM

## 2023-02-24 MED ORDER — APIXABAN 5 MG PO TABS: 5.0000 mg | ORAL_TABLET | Freq: Two times a day (BID) | ORAL | 11 refills | Status: DC

## 2023-02-24 NOTE — Telephone Encounter (Signed)
Prescription refill request for Xarelto received.  Indication:afib Last office visit:5/24 Weight:72.8  kg Age:72 RUE:AVWUJ labs CrCl:needs labs  Prescription refilled

## 2023-02-24 NOTE — Telephone Encounter (Signed)
Rollene Rotunda, MD  You4 minutes ago (8:03 AM)    Happy to change to Eliquis 5 mg bid and stop Xarelto.

## 2023-05-22 ENCOUNTER — Other Ambulatory Visit: Payer: Self-pay | Admitting: Cardiology

## 2023-07-10 ENCOUNTER — Telehealth: Payer: Self-pay | Admitting: *Deleted

## 2023-07-10 DIAGNOSIS — I4819 Other persistent atrial fibrillation: Secondary | ICD-10-CM

## 2023-07-10 DIAGNOSIS — Z01818 Encounter for other preprocedural examination: Secondary | ICD-10-CM

## 2023-07-10 NOTE — Telephone Encounter (Signed)
   Pre-operative Risk Assessment    Patient Name: Carlos Bailey  DOB: Oct 20, 1950 MRN: [id number]  DATE OF LAST VISIT: 01/09/23 DR. HOCHRIEN DATE OF NEXT VISIT: NONE    Request for Surgical Clearance    Procedure:   COLONOSCOPY ; H/O COLON POLYPS  Date of Surgery:  Clearance 08/27/23                                 Surgeon:  DR. Ewing Schlein Surgeon's Group or Practice Name:  [facility] Phone number:  [phone] Fax number:  [phone]   Type of Clearance Requested:   - Medical  - Pharmacy:  Hold Apixaban (Eliquis) x 1-2 DAYS PRIOR   Type of Anesthesia:   PROPOFOL   Additional requests/questions:    Elpidio Anis   07/10/2023, 2:28 PM

## 2023-07-20 NOTE — Telephone Encounter (Signed)
Patient with diagnosis of A Fib on Eliquis for anticoagulation.    Procedure: COLONOSCOPY ; H/O COLON POLYPS  Date of procedure: 08/27/23   CHA2DS2-VASc Score = 3  This indicates a 3.2% annual risk of stroke. The patient's score is based upon: CHF History: 0 HTN History: 1 Diabetes History: 0 Stroke History: 0 Vascular Disease History: 1 Age Score: 1 Gender Score: 0   CrCl overdue Platelet count overdue  Patient will need BMP and CBC updated before clearance can be completed  **This guidance is not considered finalized until pre-operative APP has relayed final recommendations.**

## 2023-07-22 NOTE — Telephone Encounter (Signed)
Left message to call back. Pt will need bmet/cbc to be done with a tele visit a few days later for pre op clearance. I will place the lab orders and release them.

## 2023-07-22 NOTE — Addendum Note (Signed)
Addended by: Tarri Fuller on: 07/22/2023 12:40 PM   Modules accepted: Orders

## 2023-07-22 NOTE — Telephone Encounter (Signed)
Primary Cardiologist:James Hochrein, MD   Preoperative team, please contact this patient and set up a phone call appointment for further preoperative risk assessment. Please obtain consent and complete medication review. Thank you for your help.   Patient will need to complete labs prior to receiving pharmacy guidelines for holding Eliquis. Please ensure labs are scheduled and pharmacy recommendations have been received prior to scheduling virtual visit.   I also confirmed the patient resides in the state of [place]. As per [facility] Medical Board telemedicine laws, the patient must reside in the state in which the provider is licensed.   Levi Aland, NP-C  07/22/2023, 2:07 PM [address], [address] Office [phone] Fax [phone]

## 2023-07-23 NOTE — Telephone Encounter (Signed)
2ND attempt to reach the pt.

## 2023-07-24 ENCOUNTER — Telehealth: Payer: Self-pay | Admitting: *Deleted

## 2023-07-24 ENCOUNTER — Other Ambulatory Visit: Payer: Self-pay | Admitting: *Deleted

## 2023-07-24 DIAGNOSIS — Z01818 Encounter for other preprocedural examination: Secondary | ICD-10-CM

## 2023-07-24 MED ORDER — APIXABAN 5 MG PO TABS: 5.0000 mg | ORAL_TABLET | Freq: Two times a day (BID) | ORAL | 11 refills | Status: DC

## 2023-07-24 NOTE — Telephone Encounter (Signed)
Pt will come in for labs today. Orders placed. Tele appt scheduled for 08/11/23. Med rec and consent are done.

## 2023-07-24 NOTE — Telephone Encounter (Signed)
  Patient Consent for Virtual Visit        Carlos Bailey has provided verbal consent on 07/24/2023 for a virtual visit (video or telephone).   CONSENT FOR VIRTUAL VISIT FOR:  Carlos Bailey  By participating in this virtual visit I agree to the following:  I hereby voluntarily request, consent and authorize [facility] and its employed or contracted physicians, physician assistants, nurse practitioners or other licensed health care professionals (the Practitioner), to provide me with telemedicine health care services (the "Services") as deemed necessary by the treating Practitioner. I acknowledge and consent to receive the Services by the Practitioner via telemedicine. I understand that the telemedicine visit will involve communicating with the Practitioner through live audiovisual communication technology and the disclosure of certain medical information by electronic transmission. I acknowledge that I have been given the opportunity to request an in-person assessment or other available alternative prior to the telemedicine visit and am voluntarily participating in the telemedicine visit.  I understand that I have the right to withhold or withdraw my consent to the use of telemedicine in the course of my care at any time, without affecting my right to future care or treatment, and that the Practitioner or I may terminate the telemedicine visit at any time. I understand that I have the right to inspect all information obtained and/or recorded in the course of the telemedicine visit and may receive copies of available information for a reasonable fee.  I understand that some of the potential risks of receiving the Services via telemedicine include:  Delay or interruption in medical evaluation due to technological equipment failure or disruption; Information transmitted may not be sufficient (e.g. poor resolution of images) to allow for appropriate medical decision making by the  Practitioner; and/or  In rare instances, security protocols could fail, causing a breach of personal health information.  Furthermore, I acknowledge that it is my responsibility to provide information about my medical history, conditions and care that is complete and accurate to the best of my ability. I acknowledge that Practitioner's advice, recommendations, and/or decision may be based on factors not within their control, such as incomplete or inaccurate data provided by me or distortions of diagnostic images or specimens that may result from electronic transmissions. I understand that the practice of medicine is not an exact science and that Practitioner makes no warranties or guarantees regarding treatment outcomes. I acknowledge that a copy of this consent can be made available to me via my patient portal [facility] MyChart), or I can request a printed copy by calling the office of [facility].    I understand that my insurance will be billed for this visit.   I have read or had this consent read to me. I understand the contents of this consent, which adequately explains the benefits and risks of the Services being provided via telemedicine.  I have been provided ample opportunity to ask questions regarding this consent and the Services and have had my questions answered to my satisfaction. I give my informed consent for the services to be provided through the use of telemedicine in my medical care

## 2023-07-24 NOTE — Telephone Encounter (Signed)
Patient was returning call, coming in today to have labs

## 2023-07-26 LAB — BASIC METABOLIC PANEL
BUN/Creatinine Ratio: 20 (ref 10–24)
BUN: 12 mg/dL (ref 8–27)
CO2: 26 mmol/L (ref 20–29)
Calcium: 9.4 mg/dL (ref 8.6–10.2)
Chloride: 99 mmol/L (ref 96–106)
Creatinine, Ser: 0.61 mg/dL — ABNORMAL LOW (ref 0.76–1.27)
Glucose: 98 mg/dL (ref 70–99)
Potassium: 4 mmol/L (ref 3.5–5.2)
Sodium: 138 mmol/L (ref 134–144)
eGFR: 102 mL/min/{1.73_m2} (ref >59)

## 2023-07-26 LAB — CBC
Hematocrit: 41.9 % (ref 37.5–51.0)
Hemoglobin: 14.1 g/dL (ref 13.0–17.7)
MCH: 31.6 pg (ref 26.6–33.0)
MCHC: 33.7 g/dL (ref 31.5–35.7)
MCV: 94 fL (ref 79–97)
Platelets: 171 10*3/uL (ref 150–450)
RBC: 4.46 x10E6/uL (ref 4.14–5.80)
RDW: 12.6 % (ref 11.6–15.4)
WBC: 4.5 10*3/uL (ref 3.4–10.8)

## 2023-07-30 NOTE — Telephone Encounter (Signed)
Patient with diagnosis of A Fib on Eliquis for anticoagulation.     Procedure: COLONOSCOPY ; H/O COLON POLYPS  Date of procedure: 08/27/23     CHA2DS2-VASc Score = 3  This indicates a 3.2% annual risk of stroke. The patient's score is based upon: CHF History: 0 HTN History: 1 Diabetes History: 0 Stroke History: 0 Vascular Disease History: 1 Age Score: 1 Gender Score: 0   CrCl min Platelet count 171K  Per protocol, patient can hold Eliquis for 2 days

## 2023-08-11 ENCOUNTER — Ambulatory Visit: Payer: Medicare Other | Attending: Cardiology | Admitting: Emergency Medicine

## 2023-08-11 ENCOUNTER — Encounter: Payer: Self-pay | Admitting: Emergency Medicine

## 2023-08-11 DIAGNOSIS — Z01810 Encounter for preprocedural cardiovascular examination: Secondary | ICD-10-CM

## 2023-08-11 NOTE — Progress Notes (Signed)
Virtual Visit via Telephone Note   Because of Carlos Bailey's co-morbid illnesses, he is at least at moderate risk for complications without adequate follow up.  This format is felt to be most appropriate for this patient at this time.  The patient did not have access to video technology/had technical difficulties with video requiring transitioning to audio format only (telephone).  All issues noted in this document were discussed and addressed.  No physical exam could be performed with this format.  Please refer to the patient's chart for his consent to telehealth for [facility].  Evaluation Performed:  Preoperative cardiovascular risk assessment _____________   Date:  08/11/2023   Patient ID:  Carlos Bailey, DOB [birth year], MRN [id number] Patient Location:  Home Provider location:   Office  Primary Care Provider:  Rodrigo Ran, MD Primary Cardiologist:  Rollene Rotunda, MD  Chief Complaint / Patient Profile   72 y.o. y/o male with a h/o aortic insufficiency, atrial fibrillation, elevated coronary calcium score, ascending aortic aneurysm, HTN, HLD who is pending colonoscopy on 08/27/2023 by Dr. Ewing Schlein at [facility] and presents today for telephonic preoperative cardiovascular risk assessment.  History of Present Illness    Carlos Bailey is a 72 y.o. male who presents via audio/video conferencing for a telehealth visit today.  Pt was last seen in cardiology clinic on 01/09/2023 by Dr. Antoine Poche.  At that time JUWUAN BRUTSCHE was doing well.  The patient is now pending procedure as outlined above. Since his last visit, he denies chest pain, shortness of breath, lower extremity edema, fatigue, palpitations, melena, hematuria, hemoptysis, diaphoresis, weakness, presyncope, syncope, orthopnea, and PND.  Past Medical History    Past Medical History:  Diagnosis Date   Aortic insufficiency    Aortic root enlargement (HCC)    Atrial fibrillation (HCC)    HTN  (hypertension)    Hyperlipemia    Inguinal hernia    left   Past Surgical History:  Procedure Laterality Date   BACK SURGERY     BUBBLE STUDY  05/03/2020   Procedure: BUBBLE STUDY;  Surgeon: [name], MD;  Location: [facility] ENDOSCOPY;  Service: Cardiology;;   CARDIOVERSION N/A 11/26/2019   Procedure: CARDIOVERSION;  Surgeon: Pricilla Riffle, MD;  Location: [facility] ENDOSCOPY;  Service: Cardiovascular;  Laterality: N/A;   HERNIA REPAIR     RIHR as child   INGUINAL HERNIA REPAIR Left 07/16/2016   Procedure: OPEN LEFT INGUINAL HERNIA REPAIR WITH MESH;  Surgeon: Luretha Murphy, MD;  Location: [facility];  Service: General;  Laterality: Left;   INSERTION OF MESH Left 07/16/2016   Procedure: INSERTION OF MESH;  Surgeon: Luretha Murphy, MD;  Location: [facility];  Service: General;  Laterality: Left;   TEE WITHOUT CARDIOVERSION N/A 05/03/2020   Procedure: TRANSESOPHAGEAL ECHOCARDIOGRAM (TEE);  Surgeon: [name], MD;  Location: [facility] ENDOSCOPY;  Service: Cardiology;  Laterality: N/A;    Allergies  Allergies  Allergen Reactions   Codeine Nausea Only   Oxycodone-Acetaminophen Nausea And Vomiting   Pneumococcal Polysaccharide Vaccine     Other reaction(s): high temp to 104   Quinolones     Other reaction(s): Other (See Comments) Fluroquinolone antibiotics should be avoided in patients with history of aortic aneurysm/dissection   Rosuvastatin     Other reaction(s): lethargy and could not walk up the driveway   Shrimp (Diagnostic)     Other reaction(s): on imported shrimp (many years ago)  he can eat other shrimp though  Home Medications    Prior to Admission medications   Medication Sig Start Date End Date Taking? Authorizing Provider  amLODipine (NORVASC) 5 MG tablet TAKE 1 TABLET (5 MG TOTAL) BY MOUTH DAILY. 05/22/23   Rollene Rotunda, MD  apixaban (ELIQUIS) 5 MG TABS tablet Take 1 tablet (5 mg total) by mouth 2 (two) times daily. 07/24/23    Rollene Rotunda, MD  atorvastatin (LIPITOR) 20 MG tablet Take 20 mg by mouth daily.  11/16/19   [provider]  Multiple Vitamin (MULTIVITAMIN WITH MINERALS) TABS tablet Take 1 tablet by mouth daily. [employer] Multivitamin    [provider]  olmesartan (BENICAR) 40 MG tablet TAKE 1 TABLET BY MOUTH EVERY DAY IN THE EVENING 01/20/23   Rollene Rotunda, MD  Theodoro Grist COQ10/UBIQUINOL/MEGA PO Take 100 mg by mouth daily.    [provider]    Physical Exam    Vital Signs:  CAMARION DILLOW does not have vital signs available for review today.  Given telephonic nature of communication, physical exam is limited. AAOx3. NAD. Normal affect.  Speech and respirations are unlabored.  Accessory Clinical Findings    None  Assessment & Plan    1.  Preoperative Cardiovascular Risk Assessment:  According to the Revised Cardiac Risk Index (RCRI), his Perioperative Risk of Major Cardiac Event is (%): 0.4. His Functional Capacity in METs is: 9.34 according to the Duke Activity Status Index (DASI). Therefore, based on ACC/AHA guidelines, patient would be at acceptable risk for the planned procedure without further cardiovascular testing.   The patient was advised that if he develops new symptoms prior to surgery to contact our office to arrange for a follow-up visit, and he verbalized understanding.  He may hold Eliquis for 2 days prior to procedure. Please resume Eliquis as soon as possible postprocedure, at the discretion of the surgeon.    A copy of this note will be routed to requesting surgeon.  Time:   Today, I have spent 6 minutes with the patient with telehealth technology discussing medical history, symptoms, and management plan.     Denyce Robert, NP  08/11/2023, 2:32 PM

## 2024-01-13 ENCOUNTER — Other Ambulatory Visit: Payer: Self-pay | Admitting: Cardiology

## 2024-02-15 ENCOUNTER — Encounter: Payer: Self-pay | Admitting: Cardiology

## 2024-04-08 NOTE — Progress Notes (Unsigned)
 Cardiology Office Note:   Date:  04/09/2024  ID:  Carlos Bailey, DOB 06/14/1951, MRN [id number] PCP: Shayne Anes, MD  [facility] Providers Cardiologist:  Lynwood Schilling, MD {  History of Present Illness:   Carlos Bailey is a 73 y.o. male  who is referred by Shayne Anes, MD for evaluation of AI.   This was moderate to severe.  He has been seen by Dr. Lucas and Dr. Vinita at [facility] and is being followed without indication for valve replacement at this point.   He also has atrial fib and an ascending aortic dilatation.   He has moderate AI noted on MRI at [facility] in May 2024.   There was a mildly enlarged aortic root at 44 mm.    He returns for follow up.  The patient denies any new symptoms such as chest discomfort, neck or arm discomfort. There has been no new shortness of breath, PND or orthopnea. There have been no reported palpitations, presyncope or syncope.  He does not notice his atrial fib.  He swims routinely.The patient denies any new symptoms such as chest discomfort, neck or arm discomfort. There has been no new shortness of breath, PND or orthopnea. There have been no reported palpitations, presyncope or syncope.      ROS: As stated in the HPI and negative for all other systems.  Studies Reviewed:    EKG:       Risk Assessment/Calculations:    CHA2DS2-VASc Score = 3   This indicates a 3.2% annual risk of stroke. The patient's score is based upon: CHF History: 0 HTN History: 1 Diabetes History: 0 Stroke History: 0 Vascular Disease History: 1 Age Score: 1 Gender Score: 0  Physical Exam:   VS:  BP (!) 143/67   Pulse 99   Ht 5' 8 (1.727 m)   Wt 155 lb (70.3 kg)   SpO2 96%   BMI 23.57 kg/m    Wt Readings from Last 3 Encounters:  04/09/24 155 lb (70.3 kg)  01/09/23 160 lb 9.6 oz (72.8 kg)  09/27/21 161 lb 12.8 oz (73.4 kg)     GEN: Well nourished, well developed in no acute distress NECK: No JVD; No carotid bruits CARDIAC: Irregular RR, 2 out  of 6 diastolic murmur heard at the third left intercostal space, no systolic murmurs, rubs, gallops RESPIRATORY:  Clear to auscultation without rales, wheezing or rhonchi  ABDOMEN: Soft, non-tender, non-distended EXTREMITIES:  No edema; No deformity   ASSESSMENT AND PLAN:   AORTIC INSUFFICIENCY:  This was moderate thought to be moderate on MRI at [facility] in May 2024.  He is due to have follow-up of this in November at [facility].   ATRIAL FIB:   He has been in longstanding chronic atrial fibrillation.  He tolerates anticoagulation.  He is at good rate control.  He is asymptomatic.  He will continue with anticoagulation and rate control.  ELEVATED CORONARY CALCIUM: He had a negative POET (Plain Old Exercise Treadmill) in 2020 .  He has had no new cardiovascular symptoms and he has a high activity level.  No change in therapy.   ASCENDING ANEURYSM:  4.4 cm in May 2024.  This will be followed as above.  No change in therapy.  HTN: His blood pressure is slightly elevated but he says it is always in the 120s systolic and diastolics are low.  No change in therapy.   DYSLIPIDEMIA: LDL was 80 last year and he is going to have this  rechecked.  He asked about coming off his statin and I reviewed with him the reasons I would continue it.       Follow up with me in 1 year  Signed, Lynwood Schilling, MD

## 2024-04-09 ENCOUNTER — Encounter: Payer: Self-pay | Admitting: Cardiology

## 2024-04-09 ENCOUNTER — Ambulatory Visit: Attending: Cardiology | Admitting: Cardiology

## 2024-04-09 VITALS — BP 143/67 | HR 99 | Ht 68.0 in | Wt 155.0 lb

## 2024-04-09 DIAGNOSIS — R931 Abnormal findings on diagnostic imaging of heart and coronary circulation: Secondary | ICD-10-CM | POA: Insufficient documentation

## 2024-04-09 DIAGNOSIS — I10 Essential (primary) hypertension: Secondary | ICD-10-CM | POA: Insufficient documentation

## 2024-04-09 DIAGNOSIS — I351 Nonrheumatic aortic (valve) insufficiency: Secondary | ICD-10-CM | POA: Insufficient documentation

## 2024-04-09 DIAGNOSIS — E785 Hyperlipidemia, unspecified: Secondary | ICD-10-CM | POA: Insufficient documentation

## 2024-04-09 DIAGNOSIS — I7121 Aneurysm of the ascending aorta, without rupture: Secondary | ICD-10-CM | POA: Diagnosis present

## 2024-04-09 NOTE — Patient Instructions (Signed)
 Medication Instructions:  Continue same medications *If you need a refill on your cardiac medications before your next appointment, please call your pharmacy*  Lab Work: None ordered  Testing/Procedures: None ordered  Follow-Up: At [facility], you and your health needs are our priority.  As part of our continuing mission to provide you with exceptional heart care, our providers are all part of one team.  This team includes your primary Cardiologist (physician) and Advanced Practice Providers or APPs (Physician Assistants and Nurse Practitioners) who all work together to provide you with the care you need, when you need it.  Your next appointment:  1 year   Call in April to schedule August appointment     Provider:  Dr.Hochrein   We recommend signing up for the patient portal called MyChart.  Sign up information is provided on this After Visit Summary.  MyChart is used to connect with patients for Virtual Visits (Telemedicine).  Patients are able to view lab/test results, encounter notes, upcoming appointments, etc.  Non-urgent messages can be sent to your provider as well.   To learn more about what you can do with MyChart, go to [email or web address].

## 2024-04-11 ENCOUNTER — Other Ambulatory Visit: Payer: Self-pay | Admitting: Cardiology

## 2024-06-09 ENCOUNTER — Other Ambulatory Visit: Payer: Self-pay | Admitting: Urology

## 2024-06-09 DIAGNOSIS — R972 Elevated prostate specific antigen [PSA]: Secondary | ICD-10-CM

## 2024-07-11 ENCOUNTER — Ambulatory Visit
Admission: RE | Admit: 2024-07-11 | Discharge: 2024-07-11 | Disposition: A | Source: Ambulatory Visit | Attending: Urology

## 2024-07-11 DIAGNOSIS — R972 Elevated prostate specific antigen [PSA]: Secondary | ICD-10-CM

## 2024-07-11 MED ORDER — GADOPICLENOL 0.5 MMOL/ML IV SOLN
7.0000 mL | Freq: Once | INTRAVENOUS | Status: AC | PRN
  Administered 2024-07-11: 7 mL via INTRAVENOUS

## 2024-07-23 ENCOUNTER — Other Ambulatory Visit: Payer: Self-pay | Admitting: Cardiology

## 2024-08-15 ENCOUNTER — Other Ambulatory Visit: Payer: Self-pay | Admitting: Cardiology
